# Patient Record
Sex: Male | Born: 1965 | Race: White | Hispanic: No | Smoking: Never smoker
Health system: Southern US, Community
[De-identification: ages and names within clinical notes are randomized; demographics above are authoritative.]

## PROBLEM LIST (undated history)

## (undated) DIAGNOSIS — N419 Inflammatory disease of prostate, unspecified: Secondary | ICD-10-CM

## (undated) DIAGNOSIS — Z800 Family history of malignant neoplasm of digestive organs: Secondary | ICD-10-CM

## (undated) DIAGNOSIS — E785 Hyperlipidemia, unspecified: Secondary | ICD-10-CM

## (undated) DIAGNOSIS — Z803 Family history of malignant neoplasm of breast: Secondary | ICD-10-CM

## (undated) DIAGNOSIS — Z8349 Family history of other endocrine, nutritional and metabolic diseases: Secondary | ICD-10-CM

## (undated) DIAGNOSIS — K219 Gastro-esophageal reflux disease without esophagitis: Secondary | ICD-10-CM

## (undated) DIAGNOSIS — T7840XA Allergy, unspecified, initial encounter: Secondary | ICD-10-CM

## (undated) DIAGNOSIS — C801 Malignant (primary) neoplasm, unspecified: Secondary | ICD-10-CM

## (undated) HISTORY — DX: Inflammatory disease of prostate, unspecified: N41.9

## (undated) HISTORY — DX: Family history of malignant neoplasm of digestive organs: Z80.0

## (undated) HISTORY — DX: Malignant (primary) neoplasm, unspecified: C80.1

## (undated) HISTORY — PX: TONSILLECTOMY: SUR1361

## (undated) HISTORY — DX: Hyperlipidemia, unspecified: E78.5

## (undated) HISTORY — PX: COLONOSCOPY: SHX174

## (undated) HISTORY — DX: Gastro-esophageal reflux disease without esophagitis: K21.9

## (undated) HISTORY — DX: Family history of other endocrine, nutritional and metabolic diseases: Z83.49

## (undated) HISTORY — DX: Allergy, unspecified, initial encounter: T78.40XA

## (undated) HISTORY — DX: Family history of malignant neoplasm of breast: Z80.3

## (undated) HISTORY — PX: ESOPHAGOGASTRODUODENOSCOPY: SHX1529

---

## 1997-10-02 ENCOUNTER — Encounter: Admission: RE | Admit: 1997-10-02 | Discharge: 1997-10-02 | Payer: Self-pay | Admitting: Family Medicine

## 1997-12-22 ENCOUNTER — Ambulatory Visit (HOSPITAL_COMMUNITY): Admission: RE | Admit: 1997-12-22 | Discharge: 1997-12-22 | Payer: Self-pay | Admitting: Internal Medicine

## 2000-04-18 ENCOUNTER — Encounter: Admission: RE | Admit: 2000-04-18 | Discharge: 2000-04-18 | Payer: Self-pay | Admitting: Sports Medicine

## 2002-11-05 ENCOUNTER — Encounter: Payer: Self-pay | Admitting: Gastroenterology

## 2002-11-05 ENCOUNTER — Ambulatory Visit (HOSPITAL_COMMUNITY): Admission: RE | Admit: 2002-11-05 | Discharge: 2002-11-05 | Payer: Self-pay | Admitting: Gastroenterology

## 2002-12-07 ENCOUNTER — Emergency Department (HOSPITAL_COMMUNITY): Admission: EM | Admit: 2002-12-07 | Discharge: 2002-12-07 | Payer: Self-pay | Admitting: Emergency Medicine

## 2012-04-29 ENCOUNTER — Emergency Department (HOSPITAL_COMMUNITY)
Admission: EM | Admit: 2012-04-29 | Discharge: 2012-04-29 | Disposition: A | Discharge status: Home / Self Care | Payer: Self-pay | Attending: Emergency Medicine | Admitting: Emergency Medicine

## 2012-04-29 ENCOUNTER — Encounter (HOSPITAL_COMMUNITY): Payer: Self-pay | Admitting: *Deleted

## 2012-04-29 DIAGNOSIS — T4995XA Adverse effect of unspecified topical agent, initial encounter: Secondary | ICD-10-CM | POA: Insufficient documentation

## 2012-04-29 DIAGNOSIS — T7840XA Allergy, unspecified, initial encounter: Secondary | ICD-10-CM | POA: Insufficient documentation

## 2012-04-29 DIAGNOSIS — L299 Pruritus, unspecified: Secondary | ICD-10-CM | POA: Insufficient documentation

## 2012-04-29 DIAGNOSIS — R42 Dizziness and giddiness: Secondary | ICD-10-CM | POA: Insufficient documentation

## 2012-04-29 MED ORDER — SODIUM CHLORIDE 0.9 % IV SOLN: INTRAVENOUS | Status: DC

## 2012-04-29 MED ORDER — SODIUM CHLORIDE 0.9 % IV BOLUS (SEPSIS)
250.0000 mL | Freq: Once | INTRAVENOUS | Status: AC
  Administered 2012-04-29: 250 mL via INTRAVENOUS

## 2012-04-29 MED ORDER — FAMOTIDINE 20 MG PO TABS: 20.0000 mg | ORAL_TABLET | Freq: Two times a day (BID) | ORAL | Status: DC

## 2012-04-29 MED ORDER — PREDNISONE 10 MG PO TABS: 40.0000 mg | ORAL_TABLET | Freq: Every day | ORAL | Status: DC

## 2012-04-29 MED ORDER — DIPHENHYDRAMINE HCL 25 MG PO TABS: 25.0000 mg | ORAL_TABLET | Freq: Four times a day (QID) | ORAL | Status: DC

## 2012-04-29 NOTE — ED Notes (Signed)
PT was on walking trail with family approx one HR ago when He reports a possible bee sting to Rt ankle . Pt A/O on arrival to ED . Air way open no difficulty breathing. Pt skin appeared red on trunk posterior and anterior. Hives and rash present on face ,neck and trunk. Redness and swelling noted on dorsal rt. Foot and ankle. Meds given per EMS : .3mg  epi IM , 50mg  Benadryl IV , 50 mg Zantac IV , 125mg  solumedrol. !*g IV to RT AC. PT speaking in full sentences.

## 2012-04-30 NOTE — ED Provider Notes (Signed)
History     CSN: [id number]  Arrival date & time 04/29/12  1423   First MD Initiated Contact with Patient 04/29/12 1439      Chief Complaint  Patient presents with  . Allergic Reaction    (Consider location/radiation/quality/duration/timing/severity/associated sxs/prior treatment) Patient is a 46 y.o. male presenting with allergic reaction. The history is provided by the patient and the EMS personnel.  Allergic Reaction The primary symptoms are  rash. The primary symptoms do not include shortness of breath, cough, abdominal pain, nausea or palpitations. The current episode started less than 1 hour ago. The problem has been rapidly improving.  The rash is associated with itching.  The onset of the reaction was associated with insect bite/sting. Significant symptoms also include itching. Significant symptoms that are not present include eye redness.   Hx of bee sting allergy, thinks stung by yellow jacket to top of right ankle foot. Started with itching in that area then progressed rapidly to itchy rash all over, mostly truck and face, then swelling of tongue, difficulty speaking and tightness in throat. EMS administered 0.3 mg Epi IM, Benadryl 50 mg , Zantac 50 mg, and solumedrol 125 mg the rest IV. Patient now improving in no acute distress.   History reviewed. No pertinent past medical history.  History reviewed. No pertinent past surgical history.  No family history on file.  History  Substance Use Topics  . Smoking status: Not on file  . Smokeless tobacco: Not on file  . Alcohol Use: Not on file      Review of Systems  Constitutional: Negative for fever and diaphoresis.  HENT: Negative for congestion, drooling, trouble swallowing and voice change.   Eyes: Negative for redness.  Respiratory: Negative for cough and shortness of breath.   Cardiovascular: Negative for chest pain and palpitations.  Gastrointestinal: Negative for nausea and abdominal pain.  Genitourinary:  Negative for dysuria.  Musculoskeletal: Negative for back pain.  Skin: Positive for itching and rash.  Neurological: Positive for light-headedness. Negative for syncope and headaches.  Hematological: Does not bruise/bleed easily.  Psychiatric/Behavioral: Negative for confusion.    Allergies  Bee venom and Other  Home Medications   Current Outpatient Rx  Name  Route  Sig  Dispense  Refill  . EPIPEN 2-PAK IJ   Injection   Inject 1 each as directed as needed. For reaction         . ROSUVASTATIN CALCIUM 40 MG PO TABS   Oral   Take 20 mg by mouth daily.         Marland Kitchen DIPHENHYDRAMINE HCL 25 MG PO TABS   Oral   Take 1 tablet (25 mg total) by mouth every 6 (six) hours.   12 tablet   0   . FAMOTIDINE 20 MG PO TABS   Oral   Take 1 tablet (20 mg total) by mouth 2 (two) times daily.   14 tablet   0   . PREDNISONE 10 MG PO TABS   Oral   Take 4 tablets (40 mg total) by mouth daily.   20 tablet   0     BP 129/83  Pulse 62  Temp 98.1 F (36.7 C) (Oral)  Resp 15  SpO2 99%  Physical Exam  Nursing note and vitals reviewed. Constitutional: He is oriented to person, place, and time. He appears well-developed and well-nourished. No distress.  HENT:  Head: Normocephalic and atraumatic.  Mouth/Throat: Oropharynx is clear and moist.       No  lip or tongue swelling.   Eyes: Conjunctivae normal and EOM are normal. Pupils are equal, round, and reactive to light.  Neck: Normal range of motion. Neck supple.  Cardiovascular: Normal rate, regular rhythm, normal heart sounds and intact distal pulses.   No murmur heard. Pulmonary/Chest: Effort normal and breath sounds normal. No stridor. No respiratory distress. He has no wheezes. He has no rales.  Abdominal: Soft. Bowel sounds are normal. There is no tenderness.  Musculoskeletal: Normal range of motion. He exhibits no edema.  Neurological: He is alert and oriented to person, place, and time. No cranial nerve deficit. He exhibits normal  muscle tone. Coordination normal.  Skin: Skin is warm. Rash noted. He is not diaphoretic.       Marked erythematous coalesced hive rash most extensive on trunk, neck and head, less so to UE and LE.     ED Course  Procedures (including critical care time)  Labs Reviewed - No data to display No results found.   1. Allergic reaction       MDM   Patient with significant allergic reaction, questionable bee sting, in patient with known allergy but did not have his epi-pen with him. No additional medications required in ED. Symptoms improved by medications given by EMS. Rash improving all other symptoms resolved. Will continue prednisone, benadryl, and pepcid at home. He does have epi-pen at home and in his car.   Observed in ED no worsening of symptoms.         Shelda Jakes, MD 04/30/12 1958

## 2012-09-30 ENCOUNTER — Ambulatory Visit: Payer: Self-pay | Admitting: Sports Medicine

## 2012-10-30 ENCOUNTER — Ambulatory Visit (INDEPENDENT_AMBULATORY_CARE_PROVIDER_SITE_OTHER): Payer: BC Managed Care – PPO | Admitting: Sports Medicine

## 2012-10-30 VITALS — BP 119/82 | Ht 76.0 in | Wt 195.0 lb

## 2012-10-30 DIAGNOSIS — M76899 Other specified enthesopathies of unspecified lower limb, excluding foot: Secondary | ICD-10-CM

## 2012-10-30 DIAGNOSIS — M7062 Trochanteric bursitis, left hip: Secondary | ICD-10-CM

## 2012-10-30 MED ORDER — NITROGLYCERIN 0.2 MG/HR TD PT24: MEDICATED_PATCH | TRANSDERMAL | Status: DC

## 2012-10-30 NOTE — Assessment & Plan Note (Addendum)
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 Patient will try and nitroglycerin secondary to chronic aspect of this to see if we can improve the healing with increasing neovascularization. Patient also given home exercise program which she will do a daily basis. Patient encouraged to try ibuprofen as needed as well as an icing protocol. Patient will try these interventions and then return again in 4-6 weeks' time. At that point if he continues to have pain we'll consider doing a ultrasound as well as questionable corticosteroid injection.

## 2012-10-30 NOTE — Patient Instructions (Addendum)
Very good to see you I am giving you some exercises that will be very helpful. Try to do them daily.  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  Come back in 6 weeks and if still in pain then will consider ultrasound to see in more detail.   Nitroglycerin Protocol   Apply 1/4 nitroglycerin patch to affected area daily.  Change position of patch within the affected area every 24 hours.  You may experience a headache during the first 1-2 weeks of using the patch, these should subside.  If you experience headaches after beginning nitroglycerin patch treatment, you may take your preferred over the counter pain reliever.  Another side effect of the nitroglycerin patch is skin irritation or rash related to patch adhesive.  Please notify our office if you develop more severe headaches or rash, and stop the patch.  Tendon healing with nitroglycerin patch may require 12 to 24 weeks depending on the extent of injury.  Men should not use if taking Viagra, Cialis, or Levitra.   Do not use if you have migraines or rosacea.

## 2012-10-30 NOTE — Progress Notes (Signed)
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  States has hx of hip pain for quite some time.  Denies any injury but had acute IT band while was running.  No longer running but still playing basketball from time to time. States it stays down the lateral leg, worse after sitting a long amount of time and with certain activities. States it is a dull aching sensation and then worse with laying down at night. It does wake him up at night.  Denies swelling, denies numbness.  Patient has tried some ibuprofen with mild improvement.  Still able to do ADL's.  Denies groin pain.   Past medical history, social, surgical and family history all reviewed.   ROS: As stated in HPI  Physical Exam Blood pressure 119/82, height 6\' 4"  (1.93 m), weight 195 lb (88.451 kg). General: No apparent distress alert and oriented x3 mood and affect normal Respiratory: Patient's speak in full sentences and does not appear short of breath Skin: Warm dry intact with no signs of infection or rash Neuro: Cranial nerves II through XII are intact, neurovascularly intact in all extremities with 2+ DTRs and 2+ pulses. Left hip exam: Patient does have full external and internal range of motion. Patient has no groin pain with range of motion testing. Patient does have pain on the lateral aspect of the hip though with internal rotation. Patient is tender to palpation over the greater trochanteric area as well as down the IT band all the way to the knee. Patient does have a mildly positive Pearlean Brownie with pain on the lateral aspect of the leg. Patient has a negative straight leg test of the back. He is neurovascularly intact distally with a 2+ DTRs. The patient is extremely weak with hip abduction bilaterally.

## 2012-12-11 ENCOUNTER — Ambulatory Visit: Payer: Self-pay | Admitting: Sports Medicine

## 2014-10-17 ENCOUNTER — Encounter (HOSPITAL_COMMUNITY): Payer: Self-pay | Admitting: Emergency Medicine

## 2014-10-17 ENCOUNTER — Emergency Department (HOSPITAL_COMMUNITY)
Admission: EM | Admit: 2014-10-17 | Discharge: 2014-10-17 | Disposition: A | Discharge status: Home / Self Care | Payer: Managed Care, Other (non HMO) | Attending: Emergency Medicine | Admitting: Emergency Medicine

## 2014-10-17 DIAGNOSIS — F32A Depression, unspecified: Secondary | ICD-10-CM

## 2014-10-17 DIAGNOSIS — Z79899 Other long term (current) drug therapy: Secondary | ICD-10-CM | POA: Insufficient documentation

## 2014-10-17 DIAGNOSIS — F419 Anxiety disorder, unspecified: Secondary | ICD-10-CM | POA: Diagnosis not present

## 2014-10-17 DIAGNOSIS — F329 Major depressive disorder, single episode, unspecified: Secondary | ICD-10-CM | POA: Diagnosis not present

## 2014-10-17 DIAGNOSIS — Z87891 Personal history of nicotine dependence: Secondary | ICD-10-CM | POA: Diagnosis not present

## 2014-10-17 LAB — CBC
HCT: 42.8 % (ref 39.0–52.0)
Hemoglobin: 14.5 g/dL (ref 13.0–17.0)
MCH: 30.9 pg (ref 26.0–34.0)
MCHC: 33.9 g/dL (ref 30.0–36.0)
MCV: 91.1 fL (ref 78.0–100.0)
PLATELETS: 262 10*3/uL (ref 150–400)
RBC: 4.7 MIL/uL (ref 4.22–5.81)
RDW: 12.8 % (ref 11.5–15.5)
WBC: 10.6 10*3/uL — ABNORMAL HIGH (ref 4.0–10.5)

## 2014-10-17 LAB — COMPREHENSIVE METABOLIC PANEL
ALK PHOS: 57 U/L (ref 39–117)
ALT: 30 U/L (ref 0–53)
ANION GAP: 8 (ref 5–15)
AST: 24 U/L (ref 0–37)
Albumin: 4.7 g/dL (ref 3.5–5.2)
BUN: 12 mg/dL (ref 6–23)
CALCIUM: 9.2 mg/dL (ref 8.4–10.5)
CO2: 26 mmol/L (ref 19–32)
Chloride: 106 mmol/L (ref 96–112)
Creatinine, Ser: 0.87 mg/dL (ref 0.50–1.35)
GLUCOSE: 93 mg/dL (ref 70–99)
POTASSIUM: 4.3 mmol/L (ref 3.5–5.1)
SODIUM: 140 mmol/L (ref 135–145)
Total Bilirubin: 0.6 mg/dL (ref 0.3–1.2)
Total Protein: 7.6 g/dL (ref 6.0–8.3)

## 2014-10-17 LAB — SALICYLATE LEVEL

## 2014-10-17 LAB — ETHANOL: ALCOHOL ETHYL (B): 28 mg/dL — AB (ref 0–9)

## 2014-10-17 LAB — ACETAMINOPHEN LEVEL: Acetaminophen (Tylenol), Serum: <10 ug/mL — ABNORMAL LOW (ref 10–30)

## 2014-10-17 NOTE — ED Notes (Signed)
Pt arrived to the ED with a complaint of anxiety.  Pt states he was caught by his wife having a affair.  Pt's wife kicked him out of the house today.  Pt was anxious and called a hotline to talk about his anxiety.  The hotline employees recognized pt and called patients wife to tell her that pt had "dark thoughts."  Pt states he didn't make any ideations of suicide.  Pt denies SI/HI.  Pt states he just wants to talk to someone about the events of the day.

## 2014-10-17 NOTE — ED Notes (Signed)
Pt alert, oriented, and ambulatory upon DC> He was advised to follow up with his PCP as soon as possible. Pt leaves with wife.

## 2014-10-17 NOTE — Discharge Instructions (Signed)

## 2014-10-17 NOTE — ED Provider Notes (Signed)
CSN: [id number]     Arrival date & time 10/17/14  1935 History   First MD Initiated Contact with Patient 10/17/14 2116     Chief Complaint  Patient presents with  . Anxiety      HPI Patient presents the emergency department complaining of marital problems and anxiety and depressive symptoms.  He reports no homicidal or suicidal thoughts.  He called the suicide hotline tonight only because he was aware of his general feelings of depression and his recent split from his wife and prior to any symptoms beginning anyone to know what his options were.  He does not have guns in the house.  I spoke with the patient and the patient's spouse at length.  There is marital problems as the patient was found to be having an affair recently.  The patient was confronted by his wife 3 days ago and served with divorce paperwork.  He denies any prior suicide attempts.  He reports no suicidal thoughts at this time.  I also spoke with his wife who reports that she does not think that he will hurt himself either.  He does admit to drinking a glass of wine tonight when he went back to his office.  Past medical history: None Past surgical history: Noncontributory. Family history: Noncontributory  History  Substance Use Topics  . Smoking status: Former Research scientist (life sciences)  . Smokeless tobacco: Never Used  . Alcohol Use: Yes    Review of Systems  All other systems reviewed and are negative.     Allergies  Bee venom and Other  Home Medications   Prior to Admission medications   Medication Sig Start Date End Date Taking? Authorizing Provider  Ascorbic Acid (VITAMIN C PO) Take 1 tablet by mouth daily.   Yes Historical Provider, MD  BEE POLLEN PO Take 1 tablet by mouth daily.   Yes Historical Provider, MD  cetirizine (ZYRTEC) 10 MG tablet Take 10 mg by mouth daily as needed for allergies.   Yes Historical Provider, MD  Cholecalciferol (VITAMIN D PO) Take 1 tablet by mouth daily.   Yes Historical Provider, MD  CRANBERRY  PO Take 1 tablet by mouth daily.   Yes Historical Provider, MD  EPINEPHrine (EPIPEN 2-PAK IJ) Inject 1 each as directed as needed. For reaction   Yes Historical Provider, MD  GINSENG PO Take 1 capsule by mouth daily.   Yes Historical Provider, MD  L-ARGININE PO Take 1 capsule by mouth daily.   Yes Historical Provider, MD  Lactobacillus (ACIDOPHILUS PO) Take 1 capsule by mouth daily.   Yes Historical Provider, MD  LYSINE PO Take 1 capsule by mouth daily.   Yes Historical Provider, MD  Misc Natural Products (PROSTATE SUPPORT PO) Take 1 capsule by mouth daily.   Yes Historical Provider, MD  Omega-3 Fatty Acids (FISH OIL PO) Take 1 capsule by mouth daily.   Yes Historical Provider, MD  QUERCETIN PO Take 1 capsule by mouth daily.   Yes Historical Provider, MD  Stinging Nettle 2 % POWD Take 1 capsule by mouth daily.   Yes Historical Provider, MD  TURMERIC PO Take 1 capsule by mouth daily.   Yes Historical Provider, MD  VITAMIN A PO Take 1 tablet by mouth daily.   Yes Historical Provider, MD  nitroGLYCERIN (NITRODUR - DOSED IN MG/24 HR) 0.2 mg/hr 1/4 patch daily Patient not taking: Reported on 10/17/2014 10/30/12   Lyndal Pulley, DO   BP 130/97 mmHg  Pulse 63  Temp(Src) 98.2 F (36.8  C) (Oral)  Resp 16  SpO2 96% Physical Exam  Constitutional: He is oriented to person, place, and time. He appears well-developed and well-nourished.  HENT:  Head: Normocephalic and atraumatic.  Eyes: EOM are normal.  Neck: Normal range of motion.  Cardiovascular: Normal rate, regular rhythm, normal heart sounds and intact distal pulses.   Pulmonary/Chest: Effort normal and breath sounds normal. No respiratory distress.  Abdominal: Soft. He exhibits no distension. There is no tenderness.  Musculoskeletal: Normal range of motion.  Neurological: He is alert and oriented to person, place, and time.  Skin: Skin is warm and dry.  Psychiatric: He has a normal mood and affect. Judgment normal.  Nursing note and vitals  reviewed.   ED Course  Procedures (including critical care time) Labs Review Labs Reviewed  ACETAMINOPHEN LEVEL - Abnormal; Notable for the following:    Acetaminophen (Tylenol), Serum <10.0 (*)    All other components within normal limits  CBC - Abnormal; Notable for the following:    WBC 10.6 (*)    All other components within normal limits  ETHANOL - Abnormal; Notable for the following:    Alcohol, Ethyl (B) 28 (*)    All other components within normal limits  COMPREHENSIVE METABOLIC PANEL  SALICYLATE LEVEL    Imaging Review No results found.   EKG Interpretation None      MDM   Final diagnoses:  Depression    I had a long discussion with the patient the patient's spouse.  I do not believe the patient is a threat to himself or others at this time.  The wife agrees.  The patient is going to go home with his wife tonight.  They're currently going to work on their marital issues.  I've asked the patient to return the emergency department for any thoughts of suicide or thoughts of harming himself or others.    Jola Schmidt, MD 10/17/14 [phone]

## 2014-11-15 ENCOUNTER — Emergency Department
Admission: EM | Admit: 2014-11-15 | Discharge: 2014-11-15 | Disposition: A | Discharge status: Home / Self Care | Payer: Commercial Managed Care - POS | Attending: Emergency Medicine | Admitting: Emergency Medicine

## 2014-11-15 ENCOUNTER — Emergency Department: Payer: Self-pay

## 2014-11-15 DIAGNOSIS — K610 Anal abscess: Secondary | ICD-10-CM | POA: Diagnosis present

## 2014-11-15 DIAGNOSIS — Z539 Procedure and treatment not carried out, unspecified reason: Secondary | ICD-10-CM | POA: Insufficient documentation

## 2014-11-15 DIAGNOSIS — K611 Rectal abscess: Secondary | ICD-10-CM | POA: Insufficient documentation

## 2014-11-15 LAB — CBC AND DIFFERENTIAL
Basophils Absolute Automated: 0.01 10*3/uL (ref 0.00–0.20)
Basophils Automated: 0 %
Eosinophils Absolute Automated: 0.21 10*3/uL (ref 0.00–0.70)
Eosinophils Automated: 2 %
Hematocrit: 38.2 % — ABNORMAL LOW (ref 42.0–52.0)
Hgb: 13.3 g/dL (ref 13.0–17.0)
Immature Granulocytes Absolute: 0.03 10*3/uL
Immature Granulocytes: 0 %
Lymphocytes Absolute Automated: 2.08 10*3/uL (ref 0.50–4.40)
Lymphocytes Automated: 15 %
MCH: 31.9 pg (ref 28.0–32.0)
MCHC: 34.8 g/dL (ref 32.0–36.0)
MCV: 91.6 fL (ref 80.0–100.0)
MPV: 9.7 fL (ref 9.4–12.3)
Monocytes Absolute Automated: 1.24 10*3/uL — ABNORMAL HIGH (ref 0.00–1.20)
Monocytes: 9 %
Neutrophils Absolute: 10.56 10*3/uL — ABNORMAL HIGH (ref 1.80–8.10)
Neutrophils: 75 %
Nucleated RBC: 0 /100 WBC (ref 0–1)
Platelets: 231 10*3/uL (ref 140–400)
RBC: 4.17 10*6/uL — ABNORMAL LOW (ref 4.70–6.00)
RDW: 12 % (ref 12–15)
WBC: 14.13 10*3/uL — ABNORMAL HIGH (ref 3.50–10.80)

## 2014-11-15 NOTE — H&P (Signed)
HISTORY AND PHYSICAL EXAM  Surgery  Team 4: General Surgery/[facility], [facility] [phone]    Date Time: 11/15/2014 10:17 PM  Patient Name: Jeffery Harris  Attending Physician: Jerilynn Som, MD  Chief Complaint: perianal abscess    History of Present Illness:   Jeffery Harris is a 49 y.o. male with history of perianal abscess treated in January with I&D by general surgeon in [place] who presents to the hospital with increased perianal pain and swelling onset two days ago.  Denies fevers, chills.    Past Medical History:     Past Medical History   Diagnosis Date   . Prostatitis        Past Surgical History:   History reviewed. No pertinent past surgical history.    Family History:   History reviewed. No pertinent family history.    Social History:     History     Social History   . Marital Status: Married     Spouse Name: N/A   . Number of Children: N/A   . Years of Education: N/A     Social History Main Topics   . Smoking status: Never Smoker    . Smokeless tobacco: Not on file   . Alcohol Use: Yes   . Drug Use: No   . Sexual Activity: Not on file     Other Topics Concern   . Not on file     Social History Narrative   . No narrative on file       Allergies:     Allergies   Allergen Reactions   . Penicillins        Medications:     Prior to Admission medications    Not on File       Review of Systems:   Negative except as in HPI      Physical Exam:     Filed Vitals:    11/15/14 2032   BP: 128/82   Pulse: 79   Temp: 98.7 F (37.1 C)   Resp: 16   SpO2: 98%       Body mass index is 24.36 kg/(m^2).    Intake and Output Summary (Last 24 hours) at Date Time  No intake or output data in the 24 hours ending 11/15/14 2217    General:    Alert, cooperative, no distress, appears stated age   Head:    Normocephalic, without obvious abnormality, atraumatic   Eyes:    conjunctiva/corneas clear, EOM's intact, both eyes        Ears:    Gross hearing intact   Neck:   Supple   Lungs:     Clear to auscultation bilaterally, respirations unlabored    Heart:    Regular rate and rhythm   Abdomen:     Soft, non-tender, nondistended, no rebound/guarding   Rectal:   Left perianal abscess now spontaneously draining purulent material, minimal surrounding erythema,    Extremities:   Extremities normal, atraumatic, no cyanosis or edema   Pulses:   2+ and symmetric all extremities   Skin:   Skin color, texture, turgor normal, no rashes or lesions   Neurologic:   CNII-XII grossly intact.          Labs:     Recent CBC   Recent Labs      11/15/14   2140   RBC  4.17*   HGB  13.3   HEMATOCRIT  38.2*   MCV  91.6   MCH  31.9  MCHC  34.8   RDW  12   MPV  9.7     Recent BMP No results for input(s): GLU, BUN, CREAT, CA, NA, K, CL, CO2 in the last 24 hours.    Invalid input(s): AGAP    Rads:   none    Problem List:     Patient Active Problem List   Diagnosis   . Perianal abscess       Assessment:   49 y.o. male with perianal abscess now spontaneously draining well, patient with much relief    Plan:   Ok to discharge home  Patient will f/u with GS at home in [facility]  Pt d/w staff, Dr. Hazle Nordmann, MD  General Surgery PGY3  [phone]    I reviewed the patients presentation and exam. I agree with the plan for discharge with follow up with his surgeon at his home city as it is spontaneously draining.   Clelia Croft MD  FACS  [facility] [phone]

## 2014-11-15 NOTE — ED Provider Notes (Signed)
[place] [facility] EMERGENCY DEPARTMENT APP H&P         CLINICAL SUMMARY          Diagnosis:    .     Final diagnoses:   Perirectal abscess         MDM Notes:        MDM    Disposition:           ED Disposition     Discharge Jeffery Harris discharge to home/self care.    Condition at disposition: Stable                      CLINICAL INFORMATION        HPI:      Chief Complaint: Abscess  .    Jeffery Harris is a 49 y.o. male hx of rectal abscess (4 months ago) s/p I&D by gen surg who presents with 2 days of perirectal pain c/o radiating to LT groin, suprapubic area today. His pain is similar to his prior hx of rectal abscess. He took 4x ibuprofen with no sig relief to sxs. He denies fever, n/v/d, urinary sxs, hx of DM, taking immunosuppressants, undergoing chemo, radiation or other concerns     He is visiting from out of town from [place] and plans to return home tomorrow       History obtained from: Patient      ROS:      Positive and negative ROS elements as per HPI.  All other systems reviewed and negative.      Physical Exam:      Pulse 79  BP 128/82 mmHg  Resp 16  SpO2 98 %  Temp 98.7 F (37.1 C)    Physical Exam   Constitutional: He is oriented to person, place, and time. He appears well-developed and well-nourished. No distress.   HENT:   Head: Normocephalic and atraumatic.   Right Ear: External ear normal.   Left Ear: External ear normal.   Eyes: Conjunctivae and EOM are normal. Pupils are equal, round, and reactive to light.   Pulmonary/Chest: Effort normal. No respiratory distress.   Genitourinary: Rectal exam shows tenderness.   2-3 cm fluctuant perianal abscess, with associated erythema and induration which tracks to perineal region, no drainage, TTP   Neurological: He is alert and oriented to person, place, and time. Coordination normal.   Skin: Skin is warm and dry. No rash noted. He is not diaphoretic. There is erythema. No pallor.   Psychiatric: He has a normal mood  and affect. His behavior is normal. Judgment and thought content normal. His speech is not slurred. Cognition and memory are normal. He is communicative. He is attentive.   Nursing note and vitals reviewed.                PAST HISTORY        Primary Care Provider: Christa See, MD        PMH/PSH:    .     Past Medical History   Diagnosis Date   . Prostatitis        He has no past surgical history on file.      Social/Family History:      He reports that he has never smoked. He does not have any smokeless tobacco history on file. He reports that he drinks alcohol. He reports that he does not use illicit drugs.    History reviewed. No pertinent family history.  Listed Medications on Arrival:    .     There are no discharge medications for this patient.     Allergies: He is allergic to penicillins.            VISIT INFORMATION        Clinical Course in the ED:      9:18 PM [facility] surgery res will consult        Medications Given in the ED:    .     ED Medication Orders     None            Procedures:      Procedures      Interpretations:                  RESULTS        Lab Results:      Results     Procedure Component Value Units Date/Time    CBC and differential [[id number]]  (Abnormal) Collected:  11/15/14 2140    Specimen Information:  Blood from Blood Updated:  11/15/14 2208     WBC 14.13 (H) x10 3/uL      Hgb 13.3 g/dL      Hematocrit 40.9 (L) %      Platelets 231 x10 3/uL      RBC 4.17 (L) x10 6/uL      MCV 91.6 fL      MCH 31.9 pg      MCHC 34.8 g/dL      RDW 12 %      MPV 9.7 fL      Neutrophils 75 %      Lymphocytes Automated 15 %      Monocytes 9 %      Eosinophils Automated 2 %      Basophils Automated 0 %      Immature Granulocyte 0 %      Nucleated RBC 0 /100 WBC      Neutrophils Absolute 10.56 (H) x10 3/uL      Abs Lymph Automated 2.08 x10 3/uL      Abs Mono Automated 1.24 (H) x10 3/uL      Abs Eos Automated 0.21 x10 3/uL      Absolute Baso Automated 0.01 x10 3/uL      Absolute Immature  Granulocyte 0.03 x10 3/uL               Radiology Results:      No orders to display               Scribe Attestation:      Attestations:  I was acting as a Neurosurgeon for [employer], PA on [employer]. Treatment Team: Scribe: Kavin Leech    I am the first provider for this patient and I personally performed the services documented. Treatment Team: Scribe: Kavin Leech is scribing for me on [place],Jeffery Harris. This note accurately reflects work and decisions made by me.  Erick Blinks, PA                                 Erick Blinks, [place]  11/16/14 [phone]

## 2014-11-15 NOTE — ED Provider Notes (Signed)
Physician/Midlevel provider first contact with patient: 11/15/14 2027         The patient was seen and examined by the mid-level (physician's assistant or nurse practitioner), or fellow, and the plan of care was discussed with me. I agree with the plan as it was presented to me.   Floy Sabina, MD    [phone], M with pertinent pmhx of perianal abscess (4 months ago) s/p I&D by general surgery who p/w 2 days of perirectal pain radiating to Lt groin and suprapubic area today.  This pain is similar to his prior perianal abscess.  Denies fever, nausea, vomiting, diarrhea, urinary sx.  He took 800mg  of ibuprofen with no relief of sx.       He is visiting from out of town from [place] and plans to return home tomorrow.    Discussed with surgery who recommend discharge with outpatient follow up with his surgeon - no antibiotics.     Scribe Attestation:  I was acting as a Neurosurgeon for Eleah Lahaie, Marshia Ly, MD on Liliane Bade, [place]  I am the first provider for this patient and I personally performed the services documented. Dorathy Daft is scribing for me on [place], [place]. This note accurately reflects work and decisions made by me.  Shantea Poulton, Marshia Ly, MD      Zohaib Heeney, Marshia Ly, MD  11/15/14 [phone]

## 2014-11-15 NOTE — Discharge Instructions (Signed)
Rest.  Drink plenty of fluids.  Follow up with your surgeon as scheduled.  You may take tylenol and/or ibuprofen for pain.  Do not take more than 4 grams of tylenol in a 24 hour period or more than 800mg  ibuprofen every 8 hours.  Return to the ED at any time for worsening symptoms or any other concerns.

## 2014-11-16 DIAGNOSIS — K610 Anal abscess: Secondary | ICD-10-CM | POA: Diagnosis present

## 2015-01-17 ENCOUNTER — Encounter (HOSPITAL_COMMUNITY): Payer: Self-pay | Admitting: Emergency Medicine

## 2015-01-17 ENCOUNTER — Emergency Department (HOSPITAL_COMMUNITY)
Admission: EM | Admit: 2015-01-17 | Discharge: 2015-01-17 | Disposition: A | Discharge status: Home / Self Care | Payer: Managed Care, Other (non HMO) | Attending: Emergency Medicine | Admitting: Emergency Medicine

## 2015-01-17 ENCOUNTER — Emergency Department (HOSPITAL_COMMUNITY): Payer: Managed Care, Other (non HMO)

## 2015-01-17 DIAGNOSIS — J029 Acute pharyngitis, unspecified: Secondary | ICD-10-CM | POA: Insufficient documentation

## 2015-01-17 DIAGNOSIS — Z79899 Other long term (current) drug therapy: Secondary | ICD-10-CM | POA: Insufficient documentation

## 2015-01-17 DIAGNOSIS — F419 Anxiety disorder, unspecified: Secondary | ICD-10-CM | POA: Insufficient documentation

## 2015-01-17 DIAGNOSIS — Z87891 Personal history of nicotine dependence: Secondary | ICD-10-CM | POA: Insufficient documentation

## 2015-01-17 LAB — CBC WITH DIFFERENTIAL/PLATELET
BASOS ABS: 0 10*3/uL (ref 0.0–0.1)
Basophils Relative: 0 % (ref 0–1)
Eosinophils Absolute: 0.3 10*3/uL (ref 0.0–0.7)
Eosinophils Relative: 3 % (ref 0–5)
HEMATOCRIT: 40.2 % (ref 39.0–52.0)
Hemoglobin: 13.7 g/dL (ref 13.0–17.0)
LYMPHS PCT: 12 % (ref 12–46)
Lymphs Abs: 1.4 10*3/uL (ref 0.7–4.0)
MCH: 30.9 pg (ref 26.0–34.0)
MCHC: 34.1 g/dL (ref 30.0–36.0)
MCV: 90.5 fL (ref 78.0–100.0)
Monocytes Absolute: 0.6 10*3/uL (ref 0.1–1.0)
Monocytes Relative: 5 % (ref 3–12)
NEUTROS ABS: 9.4 10*3/uL — AB (ref 1.7–7.7)
Neutrophils Relative %: 80 % — ABNORMAL HIGH (ref 43–77)
PLATELETS: 219 10*3/uL (ref 150–400)
RBC: 4.44 MIL/uL (ref 4.22–5.81)
RDW: 13.2 % (ref 11.5–15.5)
WBC: 11.6 10*3/uL — AB (ref 4.0–10.5)

## 2015-01-17 LAB — I-STAT CHEM 8, ED
BUN: 8 mg/dL (ref 6–20)
CALCIUM ION: 1.09 mmol/L — AB (ref 1.12–1.23)
CREATININE: 1 mg/dL (ref 0.61–1.24)
Chloride: 103 mmol/L (ref 101–111)
GLUCOSE: 100 mg/dL — AB (ref 65–99)
HCT: 41 % (ref 39.0–52.0)
Hemoglobin: 13.9 g/dL (ref 13.0–17.0)
Potassium: 4 mmol/L (ref 3.5–5.1)
Sodium: 139 mmol/L (ref 135–145)
TCO2: 22 mmol/L (ref 0–100)

## 2015-01-17 LAB — RAPID STREP SCREEN (MED CTR MEBANE ONLY): STREPTOCOCCUS, GROUP A SCREEN (DIRECT): NEGATIVE

## 2015-01-17 IMAGING — CT CT NECK W/ CM
4 of 5 series · 16 of 33 positions shown, 18 images · IV contrast (OMNIPAQUE 300)
Comparison: Prior radiograph from earlier the same day.

CLINICAL DATA: Initial evaluation for acute sore throat for 1 day.

EXAM:
CT NECK WITH CONTRAST
TECHNIQUE: Multidetector CT imaging of the neck was performed using the
standard protocol following the bolus administration of intravenous
contrast.
CONTRAST:  100mL OMNIPAQUE IOHEXOL 300 MG/ML  SOLN

[Series 3: neck with st · axial · 0.39mm/px · z∈[-256,-110]mm · 4 of 123 slices shown, 5 images]
[im 25/123  soft-tissue]
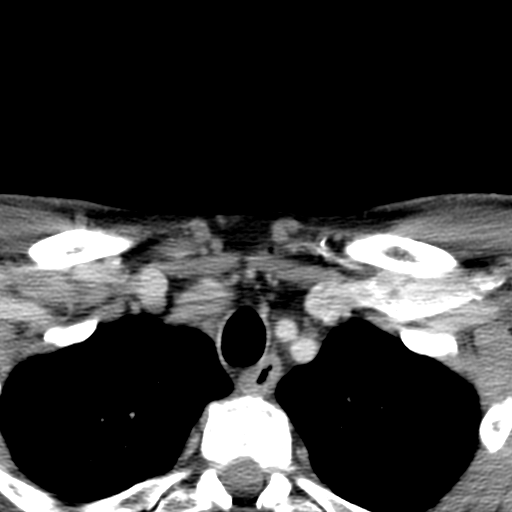
[im 25/123  bone]
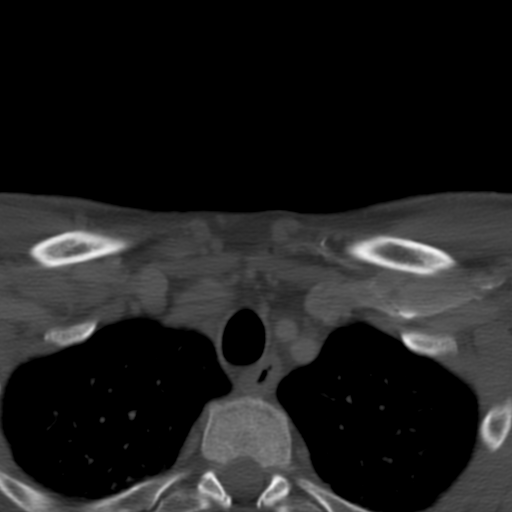
[im 49/123  bone]
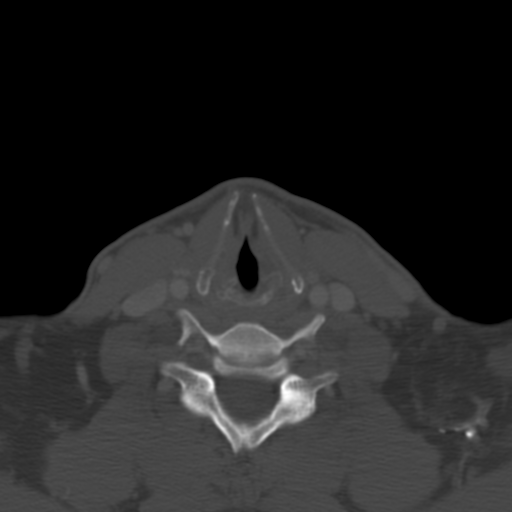
[im 74/123  bone]
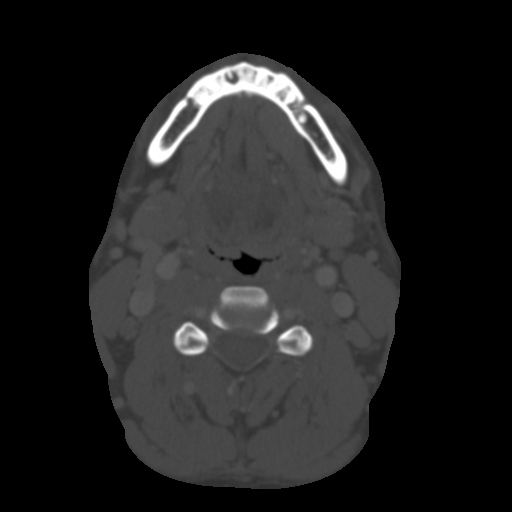
[im 98/123  bone]
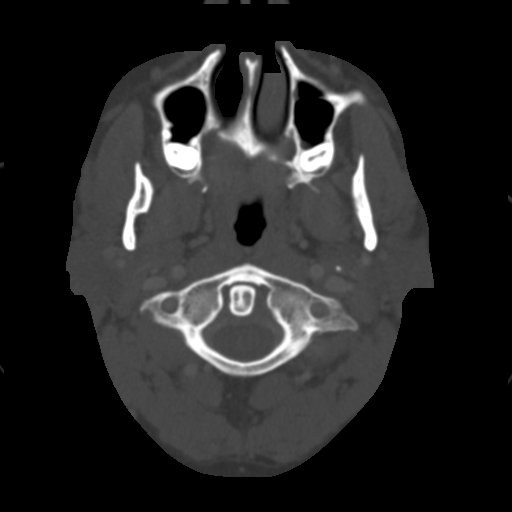

[Series 6: coronal st · coronal · 0.53mm/px · 3 of 100 slices shown]
[im 32/100  bone]
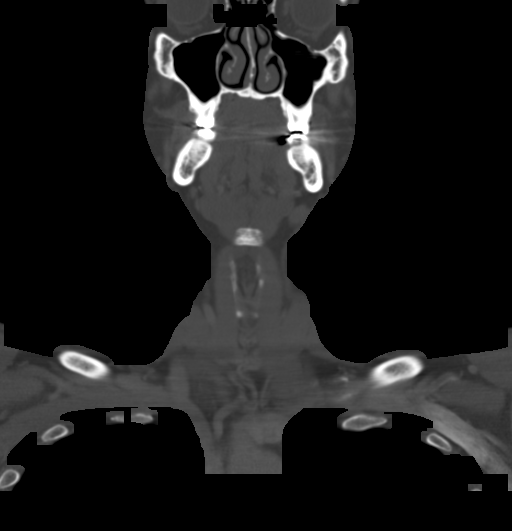
[im 44/100  bone]
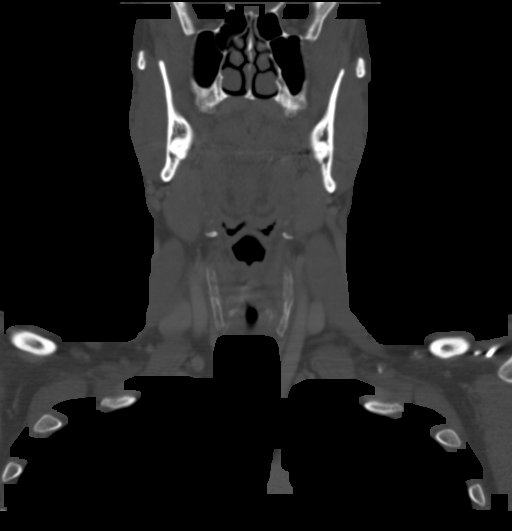
[im 56/100  bone]
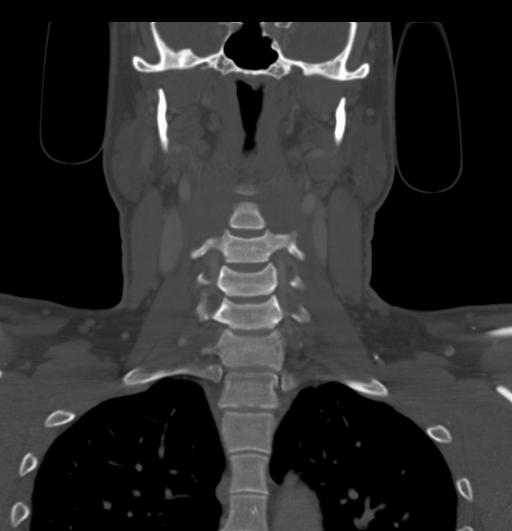

[Series 7: sagittal st · sagittal · 0.44mm/px · 5 of 82 slices shown, 6 images]
[im 28/82  bone]
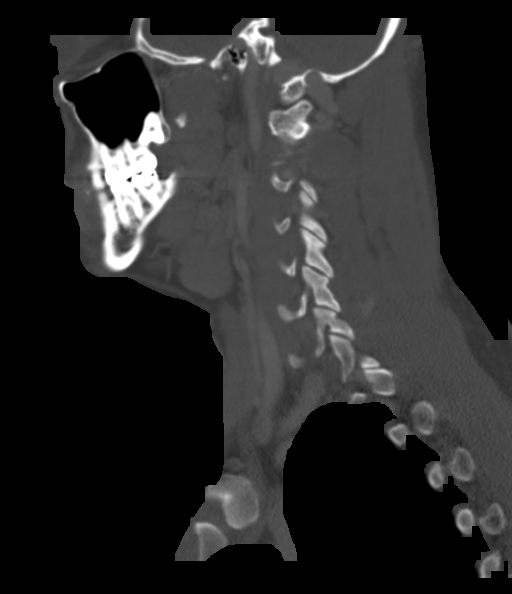
[im 34/82  bone]
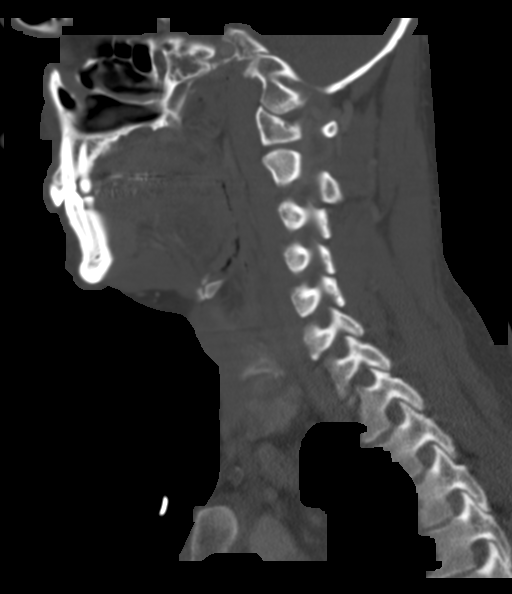
[im 41/82  soft-tissue]
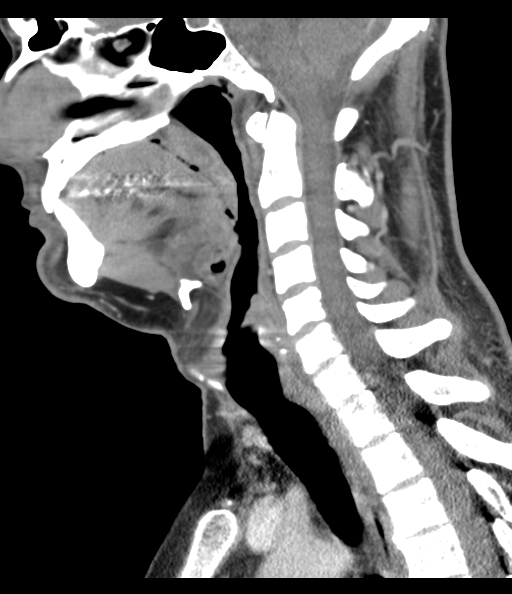
[im 41/82  bone]
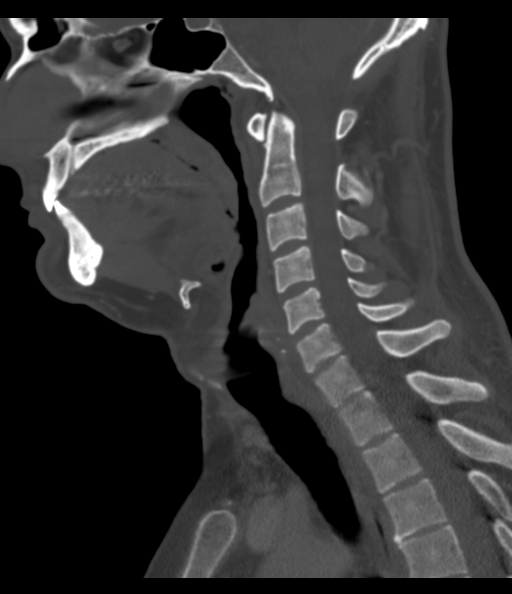
[im 48/82  bone]
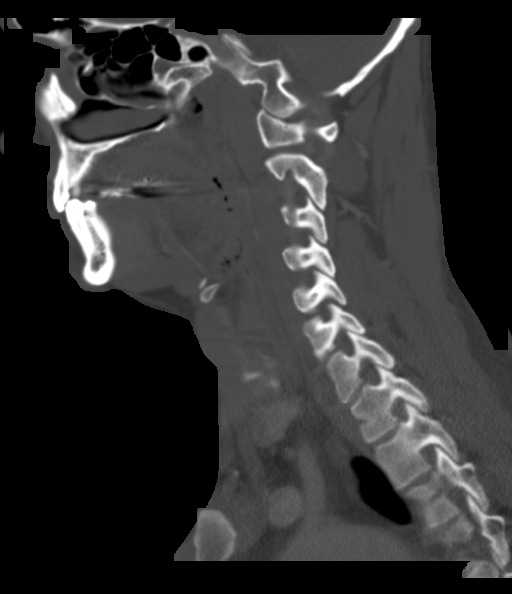
[im 55/82  bone]
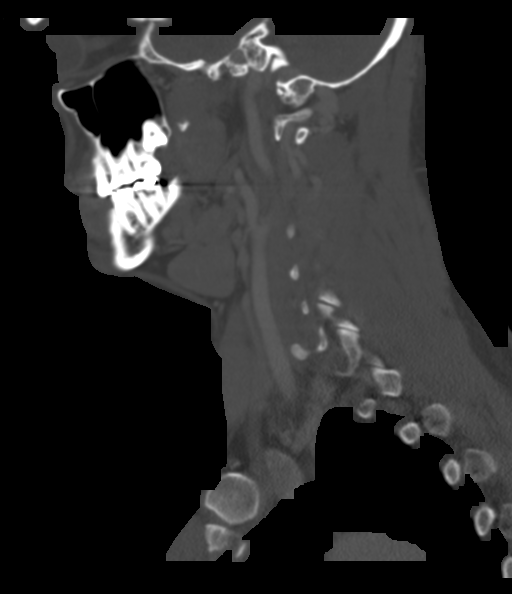

[Series 8: axial recons · axial · 0.39mm/px · z∈[-298,-149]mm · 4 of 134 slices shown]
[im 27/134  bone]
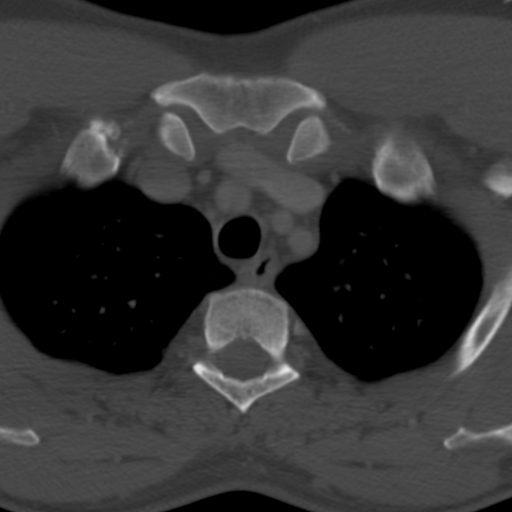
[im 54/134  bone]
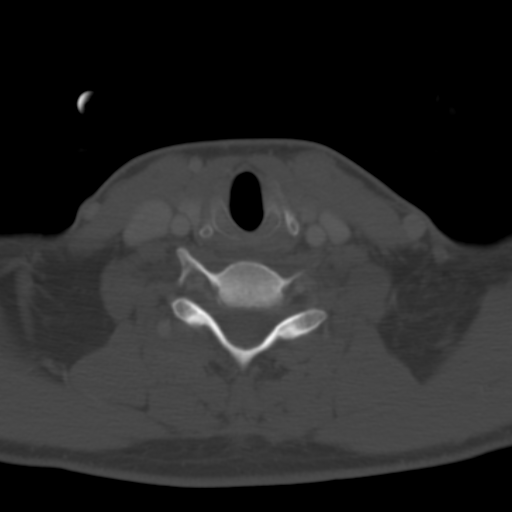
[im 80/134  bone]
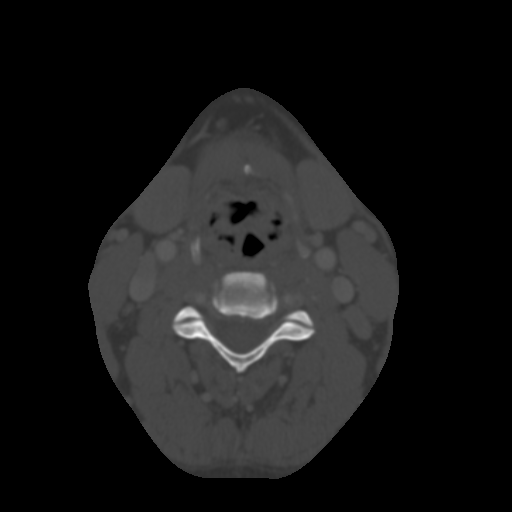
[im 107/134  bone]
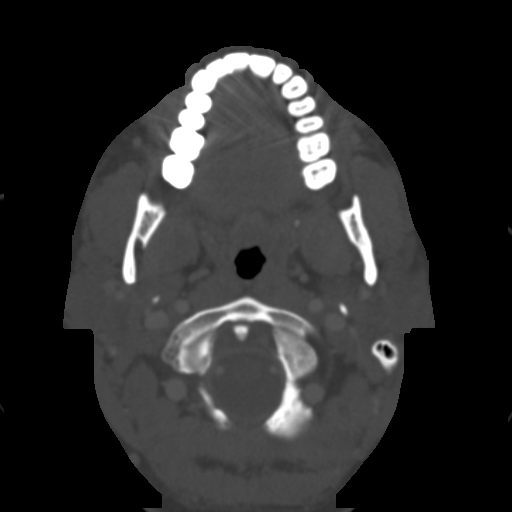

[16 of 33 positions shown; findings below may reference images not displayed]

FINDINGS: Visualized portions of the brain demonstrate a normal appearance.
Partially visualized globes and orbits are unremarkable.

Visualized paranasal sinuses and mastoid air cells are clear. Middle
ear cavities are clear.

The salivary glands including the parotid glands and submandibular
glands are normal in appearance.

Oral cavity is unremarkable without evidence of mass lesion or
loculated fluid collection. Palatine tonsils are symmetric and
normal bilaterally. Parapharyngeal fat is preserved. Oropharynx and
nasopharynx are within normal limits. Epiglottis is normal.
Vallecula is clear. Mild lingual tonsil hypertrophy. No
retropharyngeal fluid collection. No significant swelling or edema
seen within the hypopharynx or supraglottic larynx. True vocal cords
are symmetric and normal bilaterally. Subglottic airway is clear.

Thyroid gland is normal.

No pathologically enlarged lymph nodes identified within the neck.

Partially visualized superior mediastinum within normal limits.

Visualized lungs are clear.

Normal intravascular enhancement seen within the neck.

No acute osseous abnormality. No worrisome lytic or blastic osseous
lesions.
IMPRESSION: Normal neck CT.  No significant inflammatory changes identified.

## 2015-01-17 IMAGING — CR DG NECK SOFT TISSUE
2 series · 2 of 2 positions shown · non-contrast
Comparison: None.

CLINICAL DATA: Difficulty swallowing.  Sore throat for 1 day.

EXAM:
NECK SOFT TISSUES - 1+ VIEW

[w soft tissue neck ap]
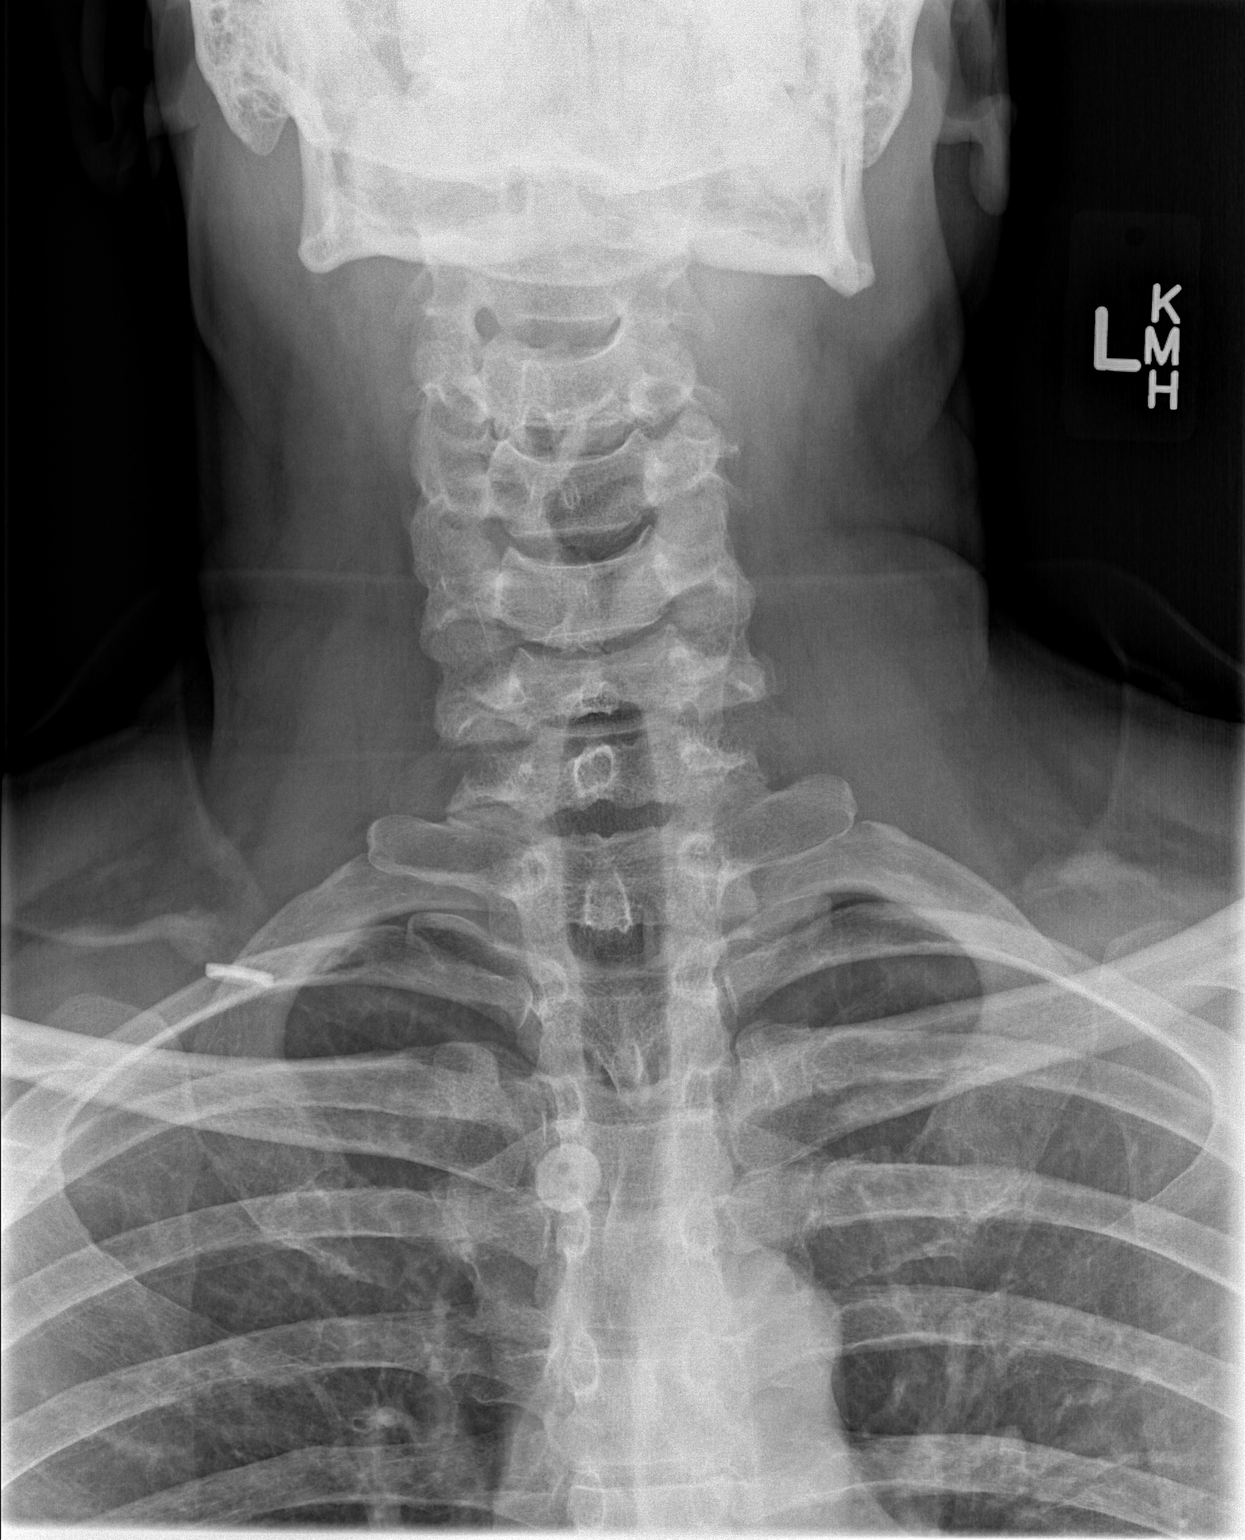

[w soft tissue neck lat]
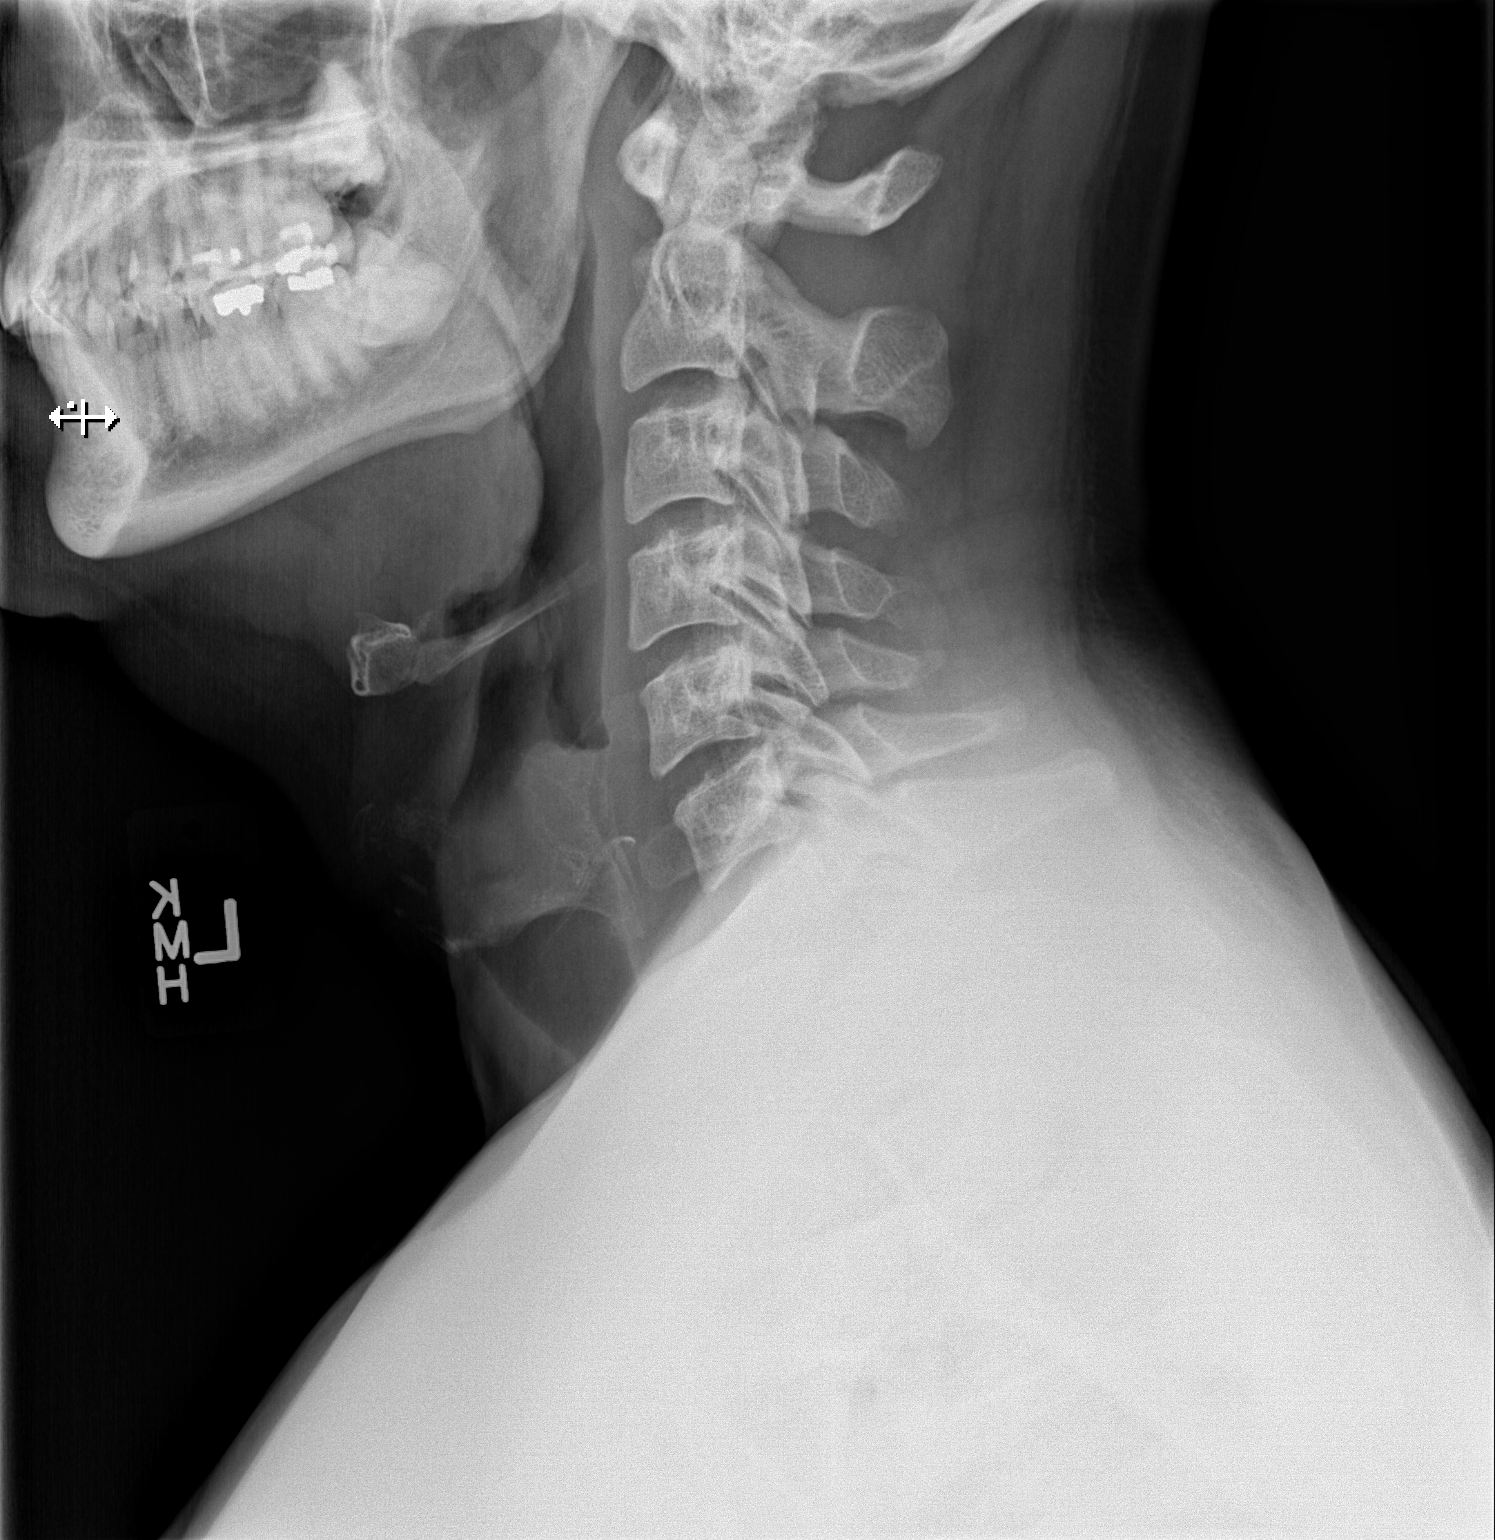

[2 of 2 positions shown; findings below may reference images not displayed]

FINDINGS: The epiglottis is poorly defined. No retropharyngeal or prevertebral
soft tissue edema. The cervical airway is unremarkable. No
radio-opaque foreign body identified.
IMPRESSION: Epiglottis is poorly defined. Suspect this is related to
positioning, however further evaluation with CT could be performed
based on clinical concern.

## 2015-01-17 MED ORDER — IBUPROFEN 600 MG PO TABS: 600.0000 mg | ORAL_TABLET | Freq: Four times a day (QID) | ORAL | Status: DC | PRN

## 2015-01-17 MED ORDER — IOHEXOL 300 MG/ML  SOLN
100.0000 mL | Freq: Once | INTRAMUSCULAR | Status: AC | PRN
  Administered 2015-01-17: 100 mL via INTRAVENOUS

## 2015-01-17 MED ORDER — PREDNISONE 20 MG PO TABS
40.0000 mg | ORAL_TABLET | Freq: Once | ORAL | Status: AC
  Administered 2015-01-17: 40 mg via ORAL
  Filled 2015-01-17: qty 2

## 2015-01-17 MED ORDER — SODIUM CHLORIDE 0.9 % IV SOLN
Freq: Once | INTRAVENOUS | Status: AC
  Administered 2015-01-17: 06:00:00 via INTRAVENOUS

## 2015-01-17 MED ORDER — ACETAMINOPHEN 325 MG PO TABS
650.0000 mg | ORAL_TABLET | Freq: Once | ORAL | Status: AC
  Administered 2015-01-17: 650 mg via ORAL
  Filled 2015-01-17: qty 2

## 2015-01-17 NOTE — ED Notes (Addendum)
Pt from home c/o sore throat x 1 day. Pt reports he feels as if his throat is swelling. Throat is red. He reports it is difficult to swallow. He took two quick chew tabs of benadryl. He reports taking gabapentin x 2 weeks. Previous medication reaction lamictal.

## 2015-01-17 NOTE — ED Notes (Signed)
Patient transported to CT 

## 2015-01-17 NOTE — Discharge Instructions (Signed)
You were evaluated in the ED for your sore throat. Your strep test was negative, your CT scan of her neck did not show any sign of infection or other emergent pathology. It is important if you to continue to take Motrin and Tylenol for your discomfort. Return to ED for any worsening symptoms.  Pharyngitis Pharyngitis is redness, pain, and swelling (inflammation) of your pharynx.  CAUSES  Pharyngitis is usually caused by infection. Most of the time, these infections are from viruses (viral) and are part of a cold. However, sometimes pharyngitis is caused by bacteria (bacterial). Pharyngitis can also be caused by allergies. Viral pharyngitis may be spread from person to person by coughing, sneezing, and personal items or utensils (cups, forks, spoons, toothbrushes). Bacterial pharyngitis may be spread from person to person by more intimate contact, such as kissing.  SIGNS AND SYMPTOMS  Symptoms of pharyngitis include:   Sore throat.   Tiredness (fatigue).   Low-grade fever.   Headache.  Joint pain and muscle aches.  Skin rashes.  Swollen lymph nodes.  Plaque-like film on throat or tonsils (often seen with bacterial pharyngitis). DIAGNOSIS  Your health care provider will ask you questions about your illness and your symptoms. Your medical history, along with a physical exam, is often all that is needed to diagnose pharyngitis. Sometimes, a rapid strep test is done. Other lab tests may also be done, depending on the suspected cause.  TREATMENT  Viral pharyngitis will usually get better in 3-4 days without the use of medicine. Bacterial pharyngitis is treated with medicines that kill germs (antibiotics).  HOME CARE INSTRUCTIONS   Drink enough water and fluids to keep your urine clear or pale yellow.   Only take over-the-counter or prescription medicines as directed by your health care provider:   If you are prescribed antibiotics, make sure you finish them even if you start to feel  better.   Do not take aspirin.   Get lots of rest.   Gargle with 8 oz of salt water ( tsp of salt per 1 qt of water) as often as every 1-2 hours to soothe your throat.   Throat lozenges (if you are not at risk for choking) or sprays may be used to soothe your throat. SEEK MEDICAL CARE IF:   You have large, tender lumps in your neck.  You have a rash.  You cough up green, yellow-brown, or bloody spit. SEEK IMMEDIATE MEDICAL CARE IF:   Your neck becomes stiff.  You drool or are unable to swallow liquids.  You vomit or are unable to keep medicines or liquids down.  You have severe pain that does not go away with the use of recommended medicines.  You have trouble breathing (not caused by a stuffy nose). MAKE SURE YOU:   Understand these instructions.  Will watch your condition.  Will get help right away if you are not doing well or get worse. Document Released: 06/05/2005 Document Revised: 03/26/2013 Document Reviewed: 02/10/2013 [facility] Patient Information 2015 [place], [place]. This information is not intended to replace advice given to you by your health care provider. Make sure you discuss any questions you have with your health care provider.  Salt Water Gargle This solution will help make your mouth and throat feel better. HOME CARE INSTRUCTIONS   Mix 1 teaspoon of salt in 8 ounces of warm water.  Gargle with this solution as much or often as you need or as directed. Swish and gargle gently if you have any sores  or wounds in your mouth.  Do not swallow this mixture. Document Released: 03/09/2004 Document Revised: 08/28/2011 Document Reviewed: 07/31/2008 [facility] Patient Information 2015 [place], [place]. This information is not intended to replace advice given to you by your health care provider. Make sure you discuss any questions you have with your health care provider.

## 2015-01-17 NOTE — ED Provider Notes (Signed)
Patient care signed out to me by Junius Creamer, NP. Please see her note for further detail on patient presentation. Plan is for follow-up on CT neck soft tissue. If negative and patient continues to maintain airway, tolerate oral fluids, may discharge home to follow-up PCP. Patient reports that her throat is still mildly sore, ease of swallowing has improved. Discussed alternating between Motrin and Tylenol at home for discomfort. No evidence of other acute or emergent pathology. CT neck is negative for any acute pathology. Filed Vitals:   01/17/15 0357 01/17/15 0400 01/17/15 0533  BP: 108/73  115/71  Pulse: 70  73  Temp:  97.8 F (36.6 C)   Resp: 18  17  SpO2: 97%  98%     Comer Locket, PA-C 01/17/15 0800  Rolland Porter, MD 01/17/15 2259

## 2015-01-17 NOTE — ED Notes (Addendum)
Pt A&Ox4. No drooling noted. Speaking full/clear sentences. RR even/unlabored. No wheezing or stridor noted. Will continue to re-assess.

## 2015-01-17 NOTE — ED Provider Notes (Signed)
CSN: [id number]     Arrival date & time 01/17/15  4098 History   First MD Initiated Contact with Patient 01/17/15 0354     Chief Complaint  Patient presents with  . Allergic Reaction     (Consider location/radiation/quality/duration/timing/severity/associated sxs/prior Treatment) HPI Comments: Patient states he's had some throat discomfort for the past 24 hours.  He woke approximately one hour ago with a very tight dry feeling in his throat.  He took Benadryl because he was afraid it was allergic reaction.  He has had a reaction to Lamictal in the past and has recently been taking Neurontin for the past 3 weeks.   Patient reports that since taking the Benadryl.  He feels slightly better  Patient is a 49 y.o. male presenting with allergic reaction. The history is provided by the patient.  Allergic Reaction Presenting symptoms: difficulty swallowing and swelling   Presenting symptoms: no difficulty breathing and no rash   Severity:  Moderate Prior allergic episodes:  Allergies to medications, food/nut allergies and seasonal allergies Context: medications   Relieved by:  Antihistamines Worsened by:  Nothing tried   History reviewed. No pertinent past medical history. History reviewed. No pertinent past surgical history. No family history on file. History  Substance Use Topics  . Smoking status: Former Research scientist (life sciences)  . Smokeless tobacco: Never Used  . Alcohol Use: Yes    Review of Systems  Constitutional: Negative for fever and chills.  HENT: Positive for sore throat and trouble swallowing. Negative for drooling.   Gastrointestinal: Negative for abdominal pain.  Genitourinary: Negative for dysuria.  Skin: Negative for rash and wound.  Neurological: Negative for dizziness, weakness, numbness and headaches.  Psychiatric/Behavioral: The patient is nervous/anxious.   All other systems reviewed and are negative.     Allergies  Bee venom and Other  Home Medications   Prior to  Admission medications   Medication Sig Start Date End Date Taking? Authorizing Provider  Ascorbic Acid (VITAMIN C PO) Take 1 tablet by mouth daily.    Historical Provider, MD  BEE POLLEN PO Take 1 tablet by mouth daily.    Historical Provider, MD  cetirizine (ZYRTEC) 10 MG tablet Take 10 mg by mouth daily as needed for allergies.    Historical Provider, MD  Cholecalciferol (VITAMIN D PO) Take 1 tablet by mouth daily.    Historical Provider, MD  CRANBERRY PO Take 1 tablet by mouth daily.    Historical Provider, MD  EPINEPHrine (EPIPEN 2-PAK IJ) Inject 1 each as directed as needed. For reaction    Historical Provider, MD  GINSENG PO Take 1 capsule by mouth daily.    Historical Provider, MD  L-ARGININE PO Take 1 capsule by mouth daily.    Historical Provider, MD  Lactobacillus (ACIDOPHILUS PO) Take 1 capsule by mouth daily.    Historical Provider, MD  LYSINE PO Take 1 capsule by mouth daily.    Historical Provider, MD  Misc Natural Products (PROSTATE SUPPORT PO) Take 1 capsule by mouth daily.    Historical Provider, MD  nitroGLYCERIN (NITRODUR - DOSED IN MG/24 HR) 0.2 mg/hr 1/4 patch daily Patient not taking: Reported on 10/17/2014 10/30/12   Lyndal Pulley, DO  Omega-3 Fatty Acids (FISH OIL PO) Take 1 capsule by mouth daily.    Historical Provider, MD  QUERCETIN PO Take 1 capsule by mouth daily.    Historical Provider, MD  Stinging Nettle 2 % POWD Take 1 capsule by mouth daily.    Historical Provider, MD  TURMERIC PO Take 1 capsule by mouth daily.    Historical Provider, MD  VITAMIN A PO Take 1 tablet by mouth daily.    Historical Provider, MD   BP 108/73 mmHg  Pulse 70  Temp(Src) 97.8 F (36.6 C)  Resp 18  SpO2 97% Physical Exam  Constitutional: He appears well-developed and well-nourished. No distress.  HENT:  Head: Normocephalic.  Mouth/Throat: Uvula is midline, oropharynx is clear and moist and mucous membranes are normal. No uvula swelling. No posterior oropharyngeal edema, posterior  oropharyngeal erythema or tonsillar abscesses.  Eyes: Pupils are equal, round, and reactive to light.  Neck: Normal range of motion.  Cardiovascular: Normal rate and regular rhythm.   Pulmonary/Chest: Effort normal and breath sounds normal.  Abdominal: Soft. Bowel sounds are normal.  Musculoskeletal: Normal range of motion. He exhibits no edema.  Lymphadenopathy:    He has no cervical adenopathy.  Neurological: He is alert.  Skin: Skin is warm and dry. No rash noted. No erythema.  Nursing note and vitals reviewed.   ED Course  Procedures (including critical care time) Labs Review Labs Reviewed  RAPID STREP SCREEN (NOT AT [facility])    Imaging Review No results found.   EKG Interpretation None     Patient's strep test is negative.  On reexamination patient states he is still having discomfort when he swallows.  He is maintaining his airway is not having any drooling.  His we will to swallow his own saliva.  I will give him some Tylenol for discomfort MDM   Final diagnoses:  None         Junius Creamer, NP 01/17/15 Delrae Rend  Rolland Porter, MD 01/17/15 [phone]

## 2015-01-18 ENCOUNTER — Emergency Department (HOSPITAL_COMMUNITY)
Admission: EM | Admit: 2015-01-18 | Discharge: 2015-01-19 | Disposition: A | Discharge status: Home / Self Care | Payer: Managed Care, Other (non HMO) | Attending: Emergency Medicine | Admitting: Emergency Medicine

## 2015-01-18 ENCOUNTER — Encounter (HOSPITAL_COMMUNITY): Payer: Self-pay | Admitting: Emergency Medicine

## 2015-01-18 DIAGNOSIS — Z791 Long term (current) use of non-steroidal anti-inflammatories (NSAID): Secondary | ICD-10-CM | POA: Diagnosis not present

## 2015-01-18 DIAGNOSIS — R21 Rash and other nonspecific skin eruption: Secondary | ICD-10-CM | POA: Diagnosis present

## 2015-01-18 DIAGNOSIS — Z79899 Other long term (current) drug therapy: Secondary | ICD-10-CM | POA: Insufficient documentation

## 2015-01-18 DIAGNOSIS — J029 Acute pharyngitis, unspecified: Secondary | ICD-10-CM

## 2015-01-18 MED ORDER — FAMOTIDINE IN NACL 20-0.9 MG/50ML-% IV SOLN
20.0000 mg | Freq: Once | INTRAVENOUS | Status: AC
  Administered 2015-01-18: 20 mg via INTRAVENOUS
  Filled 2015-01-18: qty 50

## 2015-01-18 MED ORDER — SODIUM CHLORIDE 0.9 % IV BOLUS (SEPSIS)
1000.0000 mL | Freq: Once | INTRAVENOUS | Status: AC
  Administered 2015-01-18: 1000 mL via INTRAVENOUS

## 2015-01-18 NOTE — ED Provider Notes (Signed)
CSN: [id number]     Arrival date & time 01/18/15  2054 History   First MD Initiated Contact with Patient 01/18/15 2114     Chief Complaint  Patient presents with  . Insect Bite     (Consider location/radiation/quality/duration/timing/severity/associated sxs/prior Treatment) HPI Comments: Patient seen yesterday for throat swelling and again today by his PCP for same. He states he has not been able to swallow for 3 days secondary to pain and swelling. No fever. Diagnosed with likely viral illness by his doctor today but started on Z-pack and given steroid shot "in case the strep test was wrong". He states to complicate things he accidentally took a Lamictal earlier today, a medication he has had a reaction to in the past. 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 He was, however, able to maintain normal activities over the weekend.    The history is provided by the patient. No language interpreter was used.    History reviewed. No pertinent past medical history. History reviewed. No pertinent past surgical history. No family history on file. History  Substance Use Topics  . Smoking status: Never Smoker   . Smokeless tobacco: Never Used  . Alcohol Use: 12.6 oz/week    21 Glasses of wine per week    Review of Systems  Constitutional: Negative for fever and chills.  HENT: Positive for sore throat and trouble swallowing.   Respiratory: Negative.   Cardiovascular: Negative.   Gastrointestinal: Negative.  Negative for nausea and vomiting.  Musculoskeletal: Negative.   Skin: Positive for rash.  Neurological: Negative.       Allergies  Bee venom; Other; and Lamictal  Home Medications   Prior to Admission medications   Medication Sig Start Date End Date Taking? Authorizing Provider  Ascorbic Acid (VITAMIN C PO) Take 1 tablet by mouth daily.    Historical Provider, MD  BEE POLLEN PO Take 1 tablet by mouth daily.     Historical Provider, MD  cetirizine (ZYRTEC) 10 MG tablet Take 10 mg by mouth daily as needed for allergies.    Historical Provider, MD  Cholecalciferol (VITAMIN D PO) Take 1 tablet by mouth daily.    Historical Provider, MD  CRANBERRY PO Take 1 tablet by mouth daily.    Historical Provider, MD  EPINEPHrine (EPIPEN 2-PAK IJ) Inject 1 each as directed as needed. For reaction    Historical Provider, MD  gabapentin (NEURONTIN) 100 MG capsule Take 300 mg by mouth at bedtime.    Historical Provider, MD  GINSENG PO Take 1 capsule by mouth daily.    Historical Provider, MD  ibuprofen (ADVIL,MOTRIN) 600 MG tablet Take 1 tablet (600 mg total) by mouth every 6 (six) hours as needed. 01/17/15   Comer Locket, PA-C  L-ARGININE PO Take 1 capsule by mouth daily.    Historical Provider, MD  Lactobacillus (ACIDOPHILUS PO) Take 1 capsule by mouth daily.    Historical Provider, MD  LYSINE PO Take 1 capsule by mouth daily.    Historical Provider, MD  Misc Natural Products (PROSTATE SUPPORT PO) Take 1 capsule by mouth daily.    Historical Provider, MD  nitroGLYCERIN (NITRODUR - DOSED IN MG/24 HR) 0.2 mg/hr 1/4 patch daily Patient not taking: Reported on 10/17/2014 10/30/12   Lyndal Pulley, DO  Omega-3 Fatty Acids (FISH OIL PO) Take 1 capsule by mouth daily.    Historical Provider, MD  QUERCETIN PO Take 1 capsule by  mouth daily.    Historical Provider, MD  rosuvastatin (CRESTOR) 5 MG tablet Take 5 mg by mouth daily.    Historical Provider, MD  Stinging Nettle 2 % POWD Take 1 capsule by mouth daily.    Historical Provider, MD  TURMERIC PO Take 1 capsule by mouth daily.    Historical Provider, MD  VITAMIN A PO Take 1 tablet by mouth daily.    Historical Provider, MD   BP 95/55 mmHg  Pulse 76  Temp(Src) 98.8 F (37.1 C) (Oral)  Resp 18  Ht 6\' 2"  (1.88 m)  Wt 190 lb (86.183 kg)  BMI 24.38 kg/m2  SpO2 99% Physical Exam  Constitutional: He is oriented to person, place, and time. He appears well-developed and  well-nourished. No distress.  HENT:  Head: Normocephalic.  Uvula midline. Oropharynx is erythematous with minimal exudates. No excessive tonsillar swelling.   Neck: Normal range of motion. Neck supple.  Cardiovascular: Normal rate and regular rhythm.   Pulmonary/Chest: Effort normal and breath sounds normal.  Abdominal: Soft. Bowel sounds are normal. There is no tenderness. There is no rebound and no guarding.  Musculoskeletal: Normal range of motion.  Lymphadenopathy:    He has no cervical adenopathy.  Neurological: He is alert and oriented to person, place, and time.  Skin: Skin is warm and dry. No rash noted.  Psychiatric: He has a normal mood and affect.    ED Course  Procedures (including critical care time) Labs Review Labs Reviewed - No data to display  Imaging Review Dg Neck Soft Tissue  01/17/2015   CLINICAL DATA:  Difficulty swallowing.  Sore throat for 1 day.  EXAM: NECK SOFT TISSUES - 1+ VIEW  COMPARISON:  None.  FINDINGS: The epiglottis is poorly defined. No retropharyngeal or prevertebral soft tissue edema. The cervical airway is unremarkable. No radio-opaque foreign body identified.  IMPRESSION: Epiglottis is poorly defined. Suspect this is related to positioning, however further evaluation with CT could be performed based on clinical concern.   Electronically Signed   By: Jeb Levering M.D.   On: 01/17/2015 05:45   Ct Soft Tissue Neck W Contrast  01/17/2015   CLINICAL DATA:  Initial evaluation for acute sore throat for 1 day.  EXAM: CT NECK WITH CONTRAST  TECHNIQUE: Multidetector CT imaging of the neck was performed using the standard protocol following the bolus administration of intravenous contrast.  CONTRAST:  166mL OMNIPAQUE IOHEXOL 300 MG/ML  SOLN  COMPARISON:  Prior radiograph from earlier the same day.  FINDINGS: Visualized portions of the brain demonstrate a normal appearance. Partially visualized globes and orbits are unremarkable.  Visualized paranasal sinuses  and mastoid air cells are clear. Middle ear cavities are clear.  The salivary glands including the parotid glands and submandibular glands are normal in appearance.  Oral cavity is unremarkable without evidence of mass lesion or loculated fluid collection. Palatine tonsils are symmetric and normal bilaterally. Parapharyngeal fat is preserved. Oropharynx and nasopharynx are within normal limits. Epiglottis is normal. Vallecula is clear. Mild lingual tonsil hypertrophy. No retropharyngeal fluid collection. No significant swelling or edema seen within the hypopharynx or supraglottic larynx. True vocal cords are symmetric and normal bilaterally. Subglottic airway is clear.  Thyroid gland is normal.  No pathologically enlarged lymph nodes identified within the neck.  Partially visualized superior mediastinum within normal limits.  Visualized lungs are clear.  Normal intravascular enhancement seen within the neck.  No acute osseous abnormality. No worrisome lytic or blastic osseous lesions.  IMPRESSION: Normal neck  CT.  No significant inflammatory changes identified.   Electronically Signed   By: Jeannine Boga M.D.   On: 01/17/2015 07:11     EKG Interpretation None      MDM   Final diagnoses:  None    1. Pharyngitis  The patient received IM steroids by PCP earlier today - no Rx, assume Decadron. He has been given abx and is currently taking them (PO tablets) without difficulty. He is tolerating his own secretions without spitting. No vomiting. He has requested water to drink. He is taking Benadryl and has been given Pepcid. Suspect symptoms are due to viral sore throat, not allergic symptoms. No evidence to support airway compromise. He has been able to maintain normal activity including playing baseball (2 games) yesterday.  The patient is reassured by both myself and Dr. Rex Kras who has seen and examined the patient. He has been given IV fluids for comfort. He is felt appropriate for discharge  home.     Charlann Lange, PA-C 01/18/15 [place], PA-C 01/18/15 [place], MD 01/19/15 [phone]

## 2015-01-18 NOTE — ED Notes (Signed)
Patient arrived to ED via EMS. EMS reports: Patient had bee sting this evening in right forearm. Localized swelling noted. Patient had taken Benadryl 25 mg x 2 prior to EMS arrival. Ice pack applied to area by EMS. Patient also reports that he has been having URI x 2-3 days, and has been having difficulty swallowing/breathing. He also reports he is allergic to Lamictal, but accidentally took 100 mg today resulting in rash to upper thighs. VSS - BP 110/71, Pulse 88, SPO2 98%. Speaking in full sentences. 18 gauge in L hand. Saline lock.

## 2015-01-18 NOTE — ED Notes (Signed)
Charlann Lange, PA at bedside with patient and family at this time.

## 2015-01-18 NOTE — ED Notes (Signed)
Dr. Rex Kras at bedside with patient at this time.

## 2015-01-18 NOTE — ED Notes (Addendum)
Pt's shirt, wallet and watch placed in Pt belonging bag. Pt cell phone given to Pt and shoes at bedside

## 2015-01-18 NOTE — Discharge Instructions (Signed)
Salt Water Gargle This solution will help make your mouth and throat feel better. HOME CARE INSTRUCTIONS   Mix 1 teaspoon of salt in 8 ounces of warm water.  Gargle with this solution as much or often as you need or as directed. Swish and gargle gently if you have any sores or wounds in your mouth.  Do not swallow this mixture. Document Released: 03/09/2004 Document Revised: 08/28/2011 Document Reviewed: 07/31/2008 [facility] Patient Information 2015 [place], [place]. This information is not intended to replace advice given to you by your health care provider. Make sure you discuss any questions you have with your health care provider. Pharyngitis Pharyngitis is redness, pain, and swelling (inflammation) of your pharynx.  CAUSES  Pharyngitis is usually caused by infection. Most of the time, these infections are from viruses (viral) and are part of a cold. However, sometimes pharyngitis is caused by bacteria (bacterial). Pharyngitis can also be caused by allergies. Viral pharyngitis may be spread from person to person by coughing, sneezing, and personal items or utensils (cups, forks, spoons, toothbrushes). Bacterial pharyngitis may be spread from person to person by more intimate contact, such as kissing.  SIGNS AND SYMPTOMS  Symptoms of pharyngitis include:   Sore throat.   Tiredness (fatigue).   Low-grade fever.   Headache.  Joint pain and muscle aches.  Skin rashes.  Swollen lymph nodes.  Plaque-like film on throat or tonsils (often seen with bacterial pharyngitis). DIAGNOSIS  Your health care provider will ask you questions about your illness and your symptoms. Your medical history, along with a physical exam, is often all that is needed to diagnose pharyngitis. Sometimes, a rapid strep test is done. Other lab tests may also be done, depending on the suspected cause.  TREATMENT  Viral pharyngitis will usually get better in 3-4 days without the use of medicine. Bacterial  pharyngitis is treated with medicines that kill germs (antibiotics).  HOME CARE INSTRUCTIONS   Drink enough water and fluids to keep your urine clear or pale yellow.   Only take over-the-counter or prescription medicines as directed by your health care provider:   If you are prescribed antibiotics, make sure you finish them even if you start to feel better.   Do not take aspirin.   Get lots of rest.   Gargle with 8 oz of salt water ( tsp of salt per 1 qt of water) as often as every 1-2 hours to soothe your throat.   Throat lozenges (if you are not at risk for choking) or sprays may be used to soothe your throat. SEEK MEDICAL CARE IF:   You have large, tender lumps in your neck.  You have a rash.  You cough up green, yellow-brown, or bloody spit. SEEK IMMEDIATE MEDICAL CARE IF:   Your neck becomes stiff.  You drool or are unable to swallow liquids.  You vomit or are unable to keep medicines or liquids down.  You have severe pain that does not go away with the use of recommended medicines.  You have trouble breathing (not caused by a stuffy nose). MAKE SURE YOU:   Understand these instructions.  Will watch your condition.  Will get help right away if you are not doing well or get worse. Document Released: 06/05/2005 Document Revised: 03/26/2013 Document Reviewed: 02/10/2013 [facility] Patient Information 2015 [place], [place]. This information is not intended to replace advice given to you by your health care provider. Make sure you discuss any questions you have with your health care provider.

## 2015-01-18 NOTE — ED Notes (Signed)
240 ml cup of warm salt water provided to patient at bedside. RN observed patient gargling with warm salt water and spitting into another cup. Also, provided room temperature cup of water - per request. No acute distress noted during gargling.

## 2015-01-19 LAB — CULTURE, GROUP A STREP: Strep A Culture: NEGATIVE

## 2015-01-22 ENCOUNTER — Ambulatory Visit (INDEPENDENT_AMBULATORY_CARE_PROVIDER_SITE_OTHER): Payer: Managed Care, Other (non HMO)

## 2015-01-22 ENCOUNTER — Ambulatory Visit (INDEPENDENT_AMBULATORY_CARE_PROVIDER_SITE_OTHER): Payer: Managed Care, Other (non HMO) | Admitting: Podiatry

## 2015-01-22 ENCOUNTER — Encounter: Payer: Self-pay | Admitting: Podiatry

## 2015-01-22 VITALS — BP 122/75 | HR 52 | Resp 15

## 2015-01-22 DIAGNOSIS — M779 Enthesopathy, unspecified: Secondary | ICD-10-CM | POA: Diagnosis not present

## 2015-01-22 DIAGNOSIS — S93602A Unspecified sprain of left foot, initial encounter: Secondary | ICD-10-CM

## 2015-01-22 DIAGNOSIS — M2042 Other hammer toe(s) (acquired), left foot: Secondary | ICD-10-CM | POA: Diagnosis not present

## 2015-01-22 MED ORDER — TRIAMCINOLONE ACETONIDE 10 MG/ML IJ SUSP
10.0000 mg | Freq: Once | INTRAMUSCULAR | Status: AC
  Administered 2015-01-22: 10 mg

## 2015-01-22 NOTE — Progress Notes (Signed)
   Subjective:    Patient ID: Francisco Phillips, male    DOB: [birth year], 49 y.o.   MRN: [id number]  HPI  Pt presents with painful knot on the lateral side of his left foot, with swelling. He states that the knot had been there for about a year but has gotten larger and increased in pain  Review of Systems  HENT: Positive for sore throat.   All other systems reviewed and are negative.      Objective:   Physical Exam        Assessment & Plan:

## 2015-01-25 NOTE — Progress Notes (Signed)
Subjective:     Patient ID: Jacelyn Grip, male   DOB: 13-Jan-1966, 48 y.o.   MRN: [id number]  HPI patient states I been getting a lot of pain in the outside of my left foot and it's making increasingly difficult for me to be active or to do sports   Review of Systems  All other systems reviewed and are negative.      Objective:   Physical Exam  Constitutional: He is oriented to person, place, and time.  Cardiovascular: Intact distal pulses.   Musculoskeletal: Normal range of motion.  Neurological: He is oriented to person, place, and time.  Skin: Skin is warm.  Nursing note and vitals reviewed.  neurovascular status intact muscle strength adequate range of motion within normal limits with inflammation and pain on the lateral side of the left foot at the base of the fifth metatarsal with fluid buildup. I checked the tendon and did not note a tear of the tendon at this time     Assessment:     Acute peroneal tendinitis base of fifth metatarsal left    Plan:     H&P and x-ray of condition reviewed. Careful injection of the lateral base of the peroneal and fifth metatarsal 3 mg dexamethasone Kenalog 5 mg Xylocaine advised on aggressive ice therapy and dispensed fascial brace with instructions on usage

## 2017-04-24 ENCOUNTER — Encounter: Payer: Self-pay | Admitting: Genetic Counselor

## 2017-05-28 ENCOUNTER — Other Ambulatory Visit: Payer: Self-pay

## 2017-05-28 ENCOUNTER — Encounter: Payer: Self-pay | Admitting: Genetic Counselor

## 2017-05-29 ENCOUNTER — Telehealth: Payer: Self-pay | Admitting: Genetic Counselor

## 2017-05-29 NOTE — Telephone Encounter (Signed)
LM on VM that I wanted to r/s his appointment that was cancelled on Monday.  Left CB instructions.

## 2017-06-02 ENCOUNTER — Telehealth: Payer: Self-pay | Admitting: Genetic Counselor

## 2017-06-02 NOTE — Telephone Encounter (Signed)
LVM for upcoming genetics appoitnment

## 2017-06-21 ENCOUNTER — Telehealth: Payer: Self-pay | Admitting: Genetic Counselor

## 2017-06-21 NOTE — Telephone Encounter (Signed)
Left message about change in Genetics appointment, email and letter also sent to patient

## 2017-06-22 ENCOUNTER — Encounter: Payer: Self-pay | Admitting: Internal Medicine

## 2017-07-05 ENCOUNTER — Other Ambulatory Visit: Payer: Self-pay

## 2017-07-05 ENCOUNTER — Encounter: Payer: Self-pay | Admitting: Genetic Counselor

## 2017-07-12 ENCOUNTER — Other Ambulatory Visit: Payer: Self-pay

## 2019-02-28 ENCOUNTER — Other Ambulatory Visit: Payer: Self-pay

## 2019-02-28 DIAGNOSIS — Z20822 Contact with and (suspected) exposure to covid-19: Secondary | ICD-10-CM

## 2019-03-02 LAB — NOVEL CORONAVIRUS, NAA: SARS-CoV-2, NAA: NOT DETECTED

## 2019-06-27 ENCOUNTER — Telehealth: Payer: Self-pay | Admitting: Genetic Counselor

## 2019-06-27 NOTE — Telephone Encounter (Signed)
A genetic counseling appt has been scheduled for Mr. Moya to see Roma Kayser on 12/13 at 9am for a mychart video visit. I verified that Mr. Bellerive has a Engineer, civil (consulting) as well as his email address.

## 2019-07-01 ENCOUNTER — Encounter: Payer: Self-pay | Admitting: Genetic Counselor

## 2019-07-02 ENCOUNTER — Inpatient Hospital Stay: Payer: Self-pay | Attending: Genetic Counselor | Admitting: Genetic Counselor

## 2019-07-02 ENCOUNTER — Encounter: Payer: Self-pay | Admitting: Genetic Counselor

## 2019-07-02 DIAGNOSIS — Z800 Family history of malignant neoplasm of digestive organs: Secondary | ICD-10-CM | POA: Insufficient documentation

## 2019-07-02 DIAGNOSIS — Z803 Family history of malignant neoplasm of breast: Secondary | ICD-10-CM | POA: Insufficient documentation

## 2019-07-02 DIAGNOSIS — Z8349 Family history of other endocrine, nutritional and metabolic diseases: Secondary | ICD-10-CM

## 2019-07-02 NOTE — Progress Notes (Signed)
REFERRING PROVIDER: Deland Pretty, MD [address] [place],  [place] [address]  PRIMARY PROVIDER:  Deland Pretty, MD  PRIMARY REASON FOR VISIT:  1. Family history of pancreatic cancer   2. Family history of breast cancer in male   3. Family history of cystic fibrosis      HISTORY OF PRESENT ILLNESS:   [name], a 54 y.o. male, was seen for a [place] cancer genetics consultation at the request of Dr. Shelia Media due to a family history of pancreatic cancer and male breast cancer.  Francisco Phillips presents to clinic today to discuss the possibility of a hereditary predisposition to cancer, genetic testing, and to further clarify his future cancer risks, as well as potential cancer risks for family members.   Francisco Phillips is a 54 y.o. male with no personal history of cancer.  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    CANCER HISTORY:  Oncology History   No history exists.     Past Medical History:  Diagnosis Date  . Family history of breast cancer in male   . Family history of cystic fibrosis   . Family history of pancreatic cancer     No past surgical history on file.  Social History   Socioeconomic History  . Marital status: Married    Spouse name: Not on file  . Number of children: Not on file  . Years of education: Not on file  . Highest education level: Not on file  Occupational History  . Not on file  Tobacco Use  . Smoking status: Never Smoker  . Smokeless tobacco: Never Used  Substance and Sexual Activity  . Alcohol use: Yes    Alcohol/week: 21.0 standard drinks    Types: 21 Glasses of wine per week  . Drug use: No  . Sexual activity: Yes  Other Topics Concern  . Not on file  Social History Narrative  . Not on file   Social Determinants of Health   Financial Resource Strain:   . Difficulty of Paying Living Expenses: Not on file  Food Insecurity:   . Worried About Charity fundraiser in the Last Year:  Not on file  . Ran Out of Food in the Last Year: Not on file  Transportation Needs:   . Lack of Transportation (Medical): Not on file  . Lack of Transportation (Non-Medical): Not on file  Physical Activity:   . Days of Exercise per Week: Not on file  . Minutes of Exercise per Session: Not on file  Stress:   . Feeling of Stress : Not on file  Social Connections:   . Frequency of Communication with Friends and Family: Not on file  . Frequency of Social Gatherings with Friends and Family: Not on file  . Attends Religious Services: Not on file  . Active Member of Clubs or Organizations: Not on file  . Attends Archivist Meetings: Not on file  . Marital Status: Not on file     FAMILY HISTORY:  We obtained a detailed, 4-generation family history.  Significant diagnoses are listed below: Family History  Problem Relation Age of Onset  . Pancreatic cancer Mother 76  . Breast cancer Father 29  . Pancreatic cancer Maternal Grandmother 63  . Pancreatic cancer Maternal Uncle 61  . Dementia Paternal Grandfather     The patient has three sons who are cancer free.  He has a brother and sister  who are cancer free.  His sister had a daughter who died of Cystic Fibrosis at age 69.  His mother is deceased and his father is living.  The patient's mother had pancreatic cancer at age 70.  She had a brother and sister.  Her sister died before age 73 from unknown reasons and her brother died of pancreatic cancer at 64.  The maternal grandparents are deceased.  The grandmother also died of pancreatic cancer in her 35's.  The patient's father was diagnosed with breast cancer at 44.  It is unknown if he had genetic testing.  He has two sister and a brother who reportedly are cancer free.  His parents are deceased from non-cancer related issue.    Francisco Phillips is unaware of previous family history of genetic testing for hereditary cancer risks. Patient's maternal ancestors are of [place] descent, and  paternal ancestors are of [place] descent. There is no reported Ashkenazi Jewish ancestry. There is no known consanguinity.    GENETIC COUNSELING ASSESSMENT: [name] is a 54 y.o. male with a family history of pancreatic cancer and male breast cancer which is somewhat suggestive of a hereditary cancer syndrome and predisposition to cancer given the rarity of male breast cancer and the number of people with pancreatic cancer. We, therefore, discussed and recommended the following at today's visit.   DISCUSSION: We discussed that 15 - 20% of pancreatic cancer is hereditary, with most cases associated with BRCA mutation.  There are several genes that can play a role in the development of pancreatic cancer, some due to causing pancreatitis.  The CF gene (CFTR) is a gene that can cause chronic pancreatitis which can lead to pancreatic cancer.  In further discussion, the CF seems to be coming from his father's side of the family and not the maternal side.  Therefore, we would be suspicious of other high risk pancreatic cancer genes.    Male breast cancer, as seen in his father, is rare.  The risk for a man to have breat cancer is approximately 1 in 1000.  The chance for a man who has breast cancer to have a hereditary mutation causing his breast cancer is approximately 2 out of 3.  Most commonly this is a BRCA mutation, however there are other genes that can cause male breast cancer.  We discussed that testing is beneficial for several reasons including knowing how to follow individuals after completing their treatment,and understand if other family members could be at risk for cancer and allow them to undergo genetic testing.   We reviewed the characteristics, features and inheritance patterns of hereditary cancer syndromes. We also discussed genetic testing, including the appropriate family members to test, the process of testing, insurance coverage and turn-around-time for results. We discussed the  implications of a negative, positive, carrier and/or variant of uncertain significant result. We recommended Mr. Nihiser pursue genetic testing for the common hereditary cancer gene panel to include the preliminary pancreatic cancer genes.  Based on Mr. Linck's family history of cancer, he meets medical criteria for genetic testing. Despite that he meets criteria, he may still have an out of pocket cost. We discussed that if his out of pocket cost for testing is over $100, the laboratory will call and confirm whether he wants to proceed with testing.  If the out of pocket cost of testing is less than $100 he will be billed by the genetic testing laboratory.   PLAN: After considering the risks, benefits, and limitations, Mr. Eskew  provided informed consent to pursue genetic testing.  A saliva kit will be sent in the mail and Mr. Birt will be responsible for sending it [place] for analysis of the common hereditary cancer panel and the preliminary pancreatic cancer genes. Results should be available within approximately 2-3 weeks' time, at which point they will be disclosed by telephone to Mr. Lumadue, as will any additional recommendations warranted by these results. Mr. Monestime will receive a summary of his genetic counseling visit and a copy of his results once available. This information will also be available in Epic.   Lastly, we encouraged Mr. Clift to remain in contact with cancer genetics annually so that we can continuously update the family history and inform him of any changes in cancer genetics and testing that may be of benefit for this family.   Mr. Gervasi questions were answered to his satisfaction today. Our contact information was provided should additional questions or concerns arise. Thank you for the referral and allowing [place] to share in the care of your patient.   Lillyonna Armstead P. [place]ne [place], [place], [facility] Licensed, Insurance risk surveyor [email or web address] phone:  [phone]  The patient was seen for a total of 45 minutes in face-to-face genetic counseling.  This patient was discussed with Drs. Magrinat, Lindi Adie and/or Burr Medico who agrees with the above.    _______________________________________________________________________ For Office Staff:  Number of people involved in session: 1 Was an Intern/ student involved with case: no

## 2019-07-08 ENCOUNTER — Telehealth: Payer: Self-pay | Admitting: Genetic Counselor

## 2019-07-08 NOTE — Telephone Encounter (Signed)
Patient stated that he will send a copy of his insurance card either today or tomorrow to my email.  Discussed his eligibility for pancreatic cancer screening.

## 2019-07-08 NOTE — Telephone Encounter (Signed)
3rd attempt to reach patient to get updated copy of insurance card.  Left CB instructions.

## 2019-08-18 HISTORY — PX: UPPER GASTROINTESTINAL ENDOSCOPY: SHX188

## 2019-08-26 ENCOUNTER — Telehealth: Payer: Self-pay | Admitting: Genetic Counselor

## 2019-08-26 ENCOUNTER — Encounter: Payer: Self-pay | Admitting: Genetic Counselor

## 2019-08-26 DIAGNOSIS — Z1379 Encounter for other screening for genetic and chromosomal anomalies: Secondary | ICD-10-CM | POA: Insufficient documentation

## 2019-08-26 NOTE — Telephone Encounter (Signed)
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  CF is paternally derived.  The patient's father had breast cancer.  No pathogenic variants to explain that cancer either.  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

## 2019-08-27 ENCOUNTER — Ambulatory Visit: Payer: Self-pay | Admitting: Genetic Counselor

## 2019-08-27 DIAGNOSIS — Z1379 Encounter for other screening for genetic and chromosomal anomalies: Secondary | ICD-10-CM

## 2019-08-27 NOTE — Progress Notes (Signed)
HPI:  Mr. Shehan was previously seen in the [place] clinic due to a family history of male breast cancer and pancreatic cancer and concerns regarding a hereditary predisposition to cancer. Please refer to our prior cancer genetics clinic note for more information regarding our discussion, assessment and recommendations, at the time. Mr. Hanson recent genetic test results were disclosed to him, as were recommendations warranted by these results. These results and recommendations are discussed in more detail below.  CANCER HISTORY:  Oncology History   No history exists.    FAMILY HISTORY:  We obtained a detailed, 4-generation family history.  Significant diagnoses are listed below: Family History  Problem Relation Age of Onset  . Pancreatic cancer Mother 57  . Breast cancer Father 65  . Pancreatic cancer Maternal Grandmother 64  . Pancreatic cancer Maternal Uncle 61  . Dementia Paternal Grandfather     The patient has three sons who are cancer free.  He has a brother and sister who are cancer free.  His sister had a daughter who died of Cystic Fibrosis at age 82.  His mother is deceased and his father is living.  The patient's mother had pancreatic cancer at age 37.  She had a brother and sister.  Her sister died before age 69 from unknown reasons and her brother died of pancreatic cancer at 7.  The maternal grandparents are deceased.  The grandmother also died of pancreatic cancer in her 19's.  The patient's father was diagnosed with breast cancer at 51.  It is unknown if he had genetic testing.  He has two sister and a brother who reportedly are cancer free.  His parents are deceased from non-cancer related issue.    Mr. Davydov is unaware of previous family history of genetic testing for hereditary cancer risks. Patient's maternal ancestors are of [place] descent, and paternal ancestors are of [place] descent. There is no reported Ashkenazi Jewish ancestry. There is no  known consanguinity.     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 CFTR is associated with autosomal recessive cystic fibrosis and congenital bilateral absence of the vas deferens, as well as an increase risk for chronic pancreatitis in heterozygous carriers.  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 The test report has been scanned into EPIC and is located under the Molecular Pathology section of the Results Review tab.  A portion of the result report is included below for reference.     We discussed with Mr. Rabara that because current genetic testing is not perfect, it is possible there may be a gene mutation in one of these genes that current testing cannot detect, but that chance is small.  We also discussed, that there could be another gene that has not yet been discovered, or that we have not yet tested, that is responsible for the cancer diagnoses in the family. It is also possible there is a hereditary cause for the cancer in the family that Mr. Atilano did not inherit and therefore was not identified in his testing.  Therefore, it is important to remain in touch with cancer genetics in the future so that we can continue to offer Mr. Dave the most up to date genetic testing.   Genetic testing did identify a variant of uncertain significance (  VUS) was identified in the CDH1 gene called c.535A>G.  At this time, it is unknown if this variant is associated with increased cancer risk or if this is a normal finding, but most variants such as this get reclassified to being inconsequential. It should not be used to make medical management decisions. With time, we suspect the lab will determine the significance of this variant, if any. If we do learn more about it,  we will try to contact Mr. Millman to discuss it further. However, it is important to stay in touch with [place] periodically and keep the address and phone number up to date.  CLINICAL CONDITION: The CFTR gene is associated with autosomal recessive cystic fibrosis (MedGen UID: 09811). Other CFTR-related disorders include congenital bilateral absence of the vas deferens (CBAVD; MedGen UID: 91478) and an increased risk for pancreatitis (PMID: ON:7616720, SB:9536969).  Cystic fibrosis (CF) is characterized by the buildup of mucus that can damage the digestive, respiratory, and reproductive systems (PMID: PZ:3016290). The severity of CF ranges from mild to severe and there is significant variability in presentation, even within a family (PMID: PZ:3016290, PH:9248069, KL:3439511). Symptoms include chronic cough, recurring chest colds, wheezing, shortness of breath, recurring sinus infections, excessive sweating, and poor growth. In classic CF, symptoms begin in early childhood, with average life expectancy in the 21's. As the disease progresses, mucus buildup and recurring infections lead to irreparable lung damage and pulmonary complications, which are considered to be the primary cause of mortality (Cystic Fibrosis Foundation. http://www.anderson-foster.com/. ?Accessed July 17, 2016). In addition to pulmonary disease, around 80-90% of individuals with classic CF have pancreatic insufficiency, which causes digestive problems, malnutrition, and poor growth (PMID: ZI:4380089). Other symptoms include meconium ileus at birth (~20% of affected newborns), diabetes (20% of adolescents; 40-50% of adults), liver disease (5-10% of affected individuals in the first decade of life), and male infertility (>95% of affected males; PMID: [phone], BE:8256413, SR:3134513).  In mild cases of CF, symptoms may present later in life, with less severity, and may not impact life expectancy (PMID: EA:333527).  CBAVD is associated with male infertility and refers to  bilateral hypoplasia or aplasia of the vas deferens and seminal vesicles. Men are typically azoospermic but have normal testicular function and spermatogenesis, and are therefore able to have biological children with assisted reproductive technology (PMID: VM:7630507).  Hereditary pancreatitis is characterized by recurring episodes of acute inflammation of the pancreas beginning in childhood or adolescence and leading to chronic pancreatitis. The clinical presentation is highly variable and may include chronic abdominal pain, impairment of endocrine and exocrine pancreatic function, nausea and vomiting, maldigestion, and diabetes YM:[id number]). Pathogenic variants in CFTR may confer an approximately four- to ten-fold increased risk for chronic pancreatitis in heterozygous carriers. Other genetic and environmental risk factors are also known to modify this risk (PMID: MP:5493752, BJ:5393301, LJ:922322, AW:7020450, MF:1444345, ZA:3463862, SB:9536969). Chronic pancreatitis is a risk factor for pancreatic cancer (PMID: WV:2069343).  GENE INFORMATION: The CFTR gene, located on chromosome 7, encodes the cystic fibrosis transmembrane (CFTR) conductance regulator. This protein functions as a channel across the membrane of cells that produce mucus, sweat, saliva, tears, and digestive enzymes. The channel transports negatively-charged chloride ions into and out of cells, which helps to control the movement of water in tissues, and is necessary for the production of thin, freely flowing mucus lining the airways, digestive system, reproductive system, and other organs and tissues. The CFTR protein also regulates the function of other channels, such as those that transport positively-charged sodium ions,  which are necessary for the normal function of various organs (e.g., the lungs and pancreas). Variants in CFTR can cause the absence of the protein or a protein that doesn't function properly LF:9152166, XT:3432320).  INHERITANCE:  Cystic fibrosis and CBAVD exhibit autosomal recessive inheritance, and the affected individuals have two pathogenic variants-one in each copy of their CFTR genes. Affected individuals will pass one pathogenic CFTR variant to all of their children. For partners who each carry a pathogenic variant in CFTR, the risk of having have an affected child is 25% (per pregnancy).  The increased risk for hereditary pancreatitis follows an autosomal dominant pattern of inheritance. This means that an individual with a single pathogenic variant has a 50% chance of passing along that variant and the increased disease risk to their offspring.  Family members of individuals with pathogenic CFTR variants may consider genetic testing, as they may be carriers as well.  MANAGEMENT OF CARRIERS: Treatment for hereditary pancreatitis primarily focuses on pain management, maldigestion, and monitoring for diabetes and pancreatic cancer (PMID: FN:7837765). Adhering to a low-fat diet, eating small and frequent meals, taking enzyme supplements, keeping hydrated, and avoiding alcohol and smoking are advised (PMID: FN:7837765). In some cases, surgery (including total pancreatectomy with islet autotransplantation) may be warranted (PMID: AK:4744417). Specific recommendations exist for the treatment of pancreatic diabetes (PMID: LA:3849764).  Carriers are also at an increased risk of having a child affected with CF. For those of reproductive age, partners of known carriers should consider genetic testing to determine their reproductive risk. Around 1 in 75 people in the [place] are carriers of CF ([place]. http://www.anderson-foster.com/. ?Accessed July 17, 2016); however, this risk varies slightly based on ethnicity. Reproductive options are available for at-risk carrier couples, including prenatal diagnosis, IVF with preimplantation genetic diagnosis (PGD), gamete donation, and adoption. Additionally, newborn screening for CF is available in every  state.  ADDITIONAL GENETIC TESTING: We discussed with Mr. Loehrer that his genetic testing was fairly extensive.  If there are genes identified to increase cancer risk that can be analyzed in the future, we would be happy to discuss and coordinate this testing at that time.    CANCER SCREENING RECOMMENDATIONS: Mr. 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  However, the CF variant is paternally derived, and does not explain the pancreatic cancer on the maternal side of the family.  While CF carriers are at risk for chronic pancreatitis, which can result in pancreatic cancer, we have not identified a hereditary cause for his maternal family history of pancreatic cancer at this time. Most cancers happen by chance and this negative test suggests that his cancer may fall into this category.    While reassuring, this does not definitively rule out a hereditary predisposition to cancer. It is still possible that there could be genetic mutations that are undetectable by current technology. There could be genetic mutations in genes that have not been tested or identified to increase cancer risk.  Therefore, it is recommended he continue to follow the cancer management and screening guidelines provided by his primary healthcare provider.   An individual's cancer risk and medical management are not determined by genetic test results alone. Overall cancer risk assessment incorporates additional factors, including personal medical history, family history, and any available genetic information that may result in a personalized plan for cancer prevention and surveillance.  Based on his family history of pancreatic cancer, Mr. Rockford meets CAP criteria for pancreatic cancer screening.  Mr.  Bicknell is interested in discussing this.  Therefore, he has been referred to Dr. Gerrit Heck to discus pancreatic cancer screening.  RECOMMENDATIONS FOR FAMILY MEMBERS:  Individuals in this family  might be at some increased risk of developing cancer, over the general population risk, simply due to the family history of cancer. It is important that all of Mr. Milke's relatives (both men and women) know of the presence of the CF gene mutation. Site-specific genetic testing can sort out who in the family is at risk and who is not.   Mr. Howey children are have a 50% chance to have inherited this mutation. However, they are relatively young and this will not be of any consequence to them for several years. We do not test children because there is no risk to them until they are adults. We recommend they have genetic counseling and testing by the time they are in their early 20's.    We recommended women in this family have a yearly mammogram beginning at age 84, or 79 years younger than the earliest onset of cancer, an annual clinical breast exam, and perform monthly breast self-exams. Women in this family should also have a gynecological exam as recommended by their primary provider. All family members should have a colonoscopy by age 61.  It is also possible there is a hereditary cause for the cancer in Mr. Inda's family that he did not inherit and therefore was not identified in him.  Based on Mr. Librizzi's family history, we recommended his father, who was diagnosed with breast cancer, have genetic counseling and testing. Mr. Conto will let us know if we can be of any assistance in coordinating genetic counseling and/or testing for this family member.   FOLLOW-UP: Lastly, we discussed with Mr. Pebbles that cancer genetics is a rapidly advancing field and it is possible that new genetic tests will be appropriate for him and/or his family members in the future. We encouraged him to remain in contact with cancer genetics on an annual basis so we can update his personal and family histories and let him know of advances in cancer genetics that may benefit this family.   Our contact number was  provided. Mr. Reitmeier questions were answered to his satisfaction, and he knows he is welcome to call us at anytime with additional questions or concerns.   Roma Kayser, [place], [facility] Licensed, Certified Genetic Counselor [email or web address]

## 2019-08-28 NOTE — Progress Notes (Signed)
Spoke to patient. Appointment scheduled for later this month for pancreatic cancer screening.

## 2019-09-03 ENCOUNTER — Ambulatory Visit (INDEPENDENT_AMBULATORY_CARE_PROVIDER_SITE_OTHER): Payer: 59 | Admitting: Gastroenterology

## 2019-09-03 ENCOUNTER — Encounter: Payer: Self-pay | Admitting: Gastroenterology

## 2019-09-03 ENCOUNTER — Other Ambulatory Visit: Payer: Self-pay

## 2019-09-03 VITALS — BP 124/78 | HR 75 | Temp 97.3°F | Ht 75.0 in | Wt 194.4 lb

## 2019-09-03 DIAGNOSIS — R1310 Dysphagia, unspecified: Secondary | ICD-10-CM | POA: Diagnosis not present

## 2019-09-03 DIAGNOSIS — Z800 Family history of malignant neoplasm of digestive organs: Secondary | ICD-10-CM

## 2019-09-03 DIAGNOSIS — Z01818 Encounter for other preprocedural examination: Secondary | ICD-10-CM

## 2019-09-03 DIAGNOSIS — R748 Abnormal levels of other serum enzymes: Secondary | ICD-10-CM

## 2019-09-03 DIAGNOSIS — K219 Gastro-esophageal reflux disease without esophagitis: Secondary | ICD-10-CM | POA: Diagnosis not present

## 2019-09-03 NOTE — Progress Notes (Signed)
Chief Complaint: Pancreatic cancer screening, family history of pancreatic cancer  Referring Provider:     Clarene Phillips [facility])   HPI:    Francisco Phillips is a 54 y.o. male referred to the Gastroenterology Clinic for evaluation of Pancreatic Cancer screening due to strong family history of Pancreatic Cancer. He was recently seen by Francisco Phillips in the [facility] and referred to the [facility] clinic.  Family history notable for the following: -Mother: Pancreatic cancer, age 44. Deceased -Father: Breast cancer, age 76 -Maternal grandmother: Pancreatic cancer, age 33 -Maternal uncle: Pancreatic cancer, age 54 -His 3 sons, his brother, and his sister are all cancer free. -Sister's daughter (his niece) with cystic fibrosis. 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  An additional variant of uncertain significance (VUS) identified in the [facility] gene called c.535A>G.  He does endorse occasional solid food dysphagia along with HB/regurgitation. Will treat reflux symptoms with Tums and occasional omeprazole OTC. Points to upper sternum as site of dysphagia.  Worse with meat, pasta.  No food impactions.  Can generally flush with fluids. Has had to regurgitate. EGD with dilation years ago by Francisco Phillips at [facility].   He is otherwise without any Phillips symptoms.  Weight stable, no night sweats, no n/v/f/c/d/c.  Colonoscopy years ago and ColoGuard since then. ColoGuard earlier this year was negative.  Separately, states that recent labs notable for elevated liver enzymes.  Labs not available for review today, but has referral in place to Dr. Watt Phillips at [facility] for evaluation of elevated liver enzymes.  Potentially related to EtOH consumption during pandemic.   Past Medical History:  Diagnosis Date  . Family history of breast cancer in male   . Family history of cystic fibrosis   . Family history of pancreatic cancer       Past Surgical History:  Procedure Laterality Date  . COLONOSCOPY     With Francisco Phillips   . ESOPHAGOGASTRODUODENOSCOPY     Dilation Francisco Phillips with Francisco Phillips long time ago   Family History  Problem Relation Age of Onset  . Pancreatic cancer Mother 30  . Breast cancer Father 11  . Pancreatic cancer Maternal Grandmother 50  . Pancreatic cancer Maternal Uncle 61  . Dementia Paternal Grandfather   . Colon cancer Neg Hx   . Esophageal cancer Neg Hx    Social History   Tobacco Use  . Smoking status: Never Smoker  . Smokeless tobacco: Never Used  Substance Use Topics  . Alcohol use: Yes    Alcohol/week: 21.0 standard drinks    Types: 21 Glasses of wine per week  . Drug use: No   Current Outpatient Medications  Medication Sig Dispense Refill  . Ascorbic Acid (VITAMIN C PO) Take 1 tablet by mouth daily.    . Lactobacillus (ACIDOPHILUS PO) Take 1 capsule by mouth daily.    . Liver Extract (LIVER PO) Take 1 tablet by mouth daily.    Marland Kitchen LYSINE PO Take 1 capsule by mouth daily.    . Multiple Vitamins-Minerals (IMMUNE SUPPORT PO) Take 1 tablet by mouth daily.    . Omega-3 Fatty Acids (FISH OIL PO) Take 1 capsule by mouth daily.    . rosuvastatin (CRESTOR) 5 MG tablet Take 5 mg by mouth daily.    . Tadalafil (CIALIS PO) Take 1 tablet by mouth as needed.    Marland Kitchen  TURMERIC PO Take 1 capsule by mouth daily.    Marland Kitchen EPINEPHrine (EPIPEN 2-PAK IJ) Inject 1 each as directed as needed. For reaction     No current facility-administered medications for this visit.   Allergies  Allergen Reactions  . Bee Venom Anaphylaxis, Hives and Rash  . Other Anaphylaxis, Hives and Rash    Kiwi,walnuts,spinach,scallops,poppey seeds  . Lamictal [Lamotrigine] Rash     Review of Systems: All systems reviewed and negative except where noted in HPI.     Physical Exam:    Wt Readings from Last 3 Encounters:  09/03/19 194 lb 6 oz (88.2 kg)  01/18/15 190 lb (86.2 kg)   10/30/12 195 lb (88.5 kg)    BP 124/78   Pulse 75   Temp (!) 97.3 F (36.3 C)   Ht 6\' 3"  (1.905 m)   Wt 194 lb 6 oz (88.2 kg)   BMI 24.30 kg/m  Constitutional:  Pleasant, in no acute distress. Psychiatric: Normal mood and affect. Behavior is normal. EENT: Pupils normal.  Conjunctivae are normal. No scleral icterus. Neck supple. No cervical LAD. Cardiovascular: Normal rate, regular rhythm. No edema Pulmonary/chest: Effort normal and breath sounds normal. No wheezing, rales or rhonchi. Abdominal: Soft, nondistended, nontender. Bowel sounds active throughout. There are no masses palpable. No hepatomegaly. Neurological: Alert and oriented to person place and time. Skin: Skin is warm and dry. No rashes noted.   ASSESSMENT AND PLAN;   1) Family History of Pancreatic Cancer: 54 year old male with a strong family history of Pancreatic Cancer.  Reviewed the most recent Francisco Phillips and Francisco Phillips guidelines, with screening for pancreatic cancer recommended in certain high risk individuals.  Francisco Phillips meets screening criteria based on the following:  -Individuals with at least one first-degree relative with pancreatic CA who in turn also has a first-degree relative with pancreatic CA (?2 affected, genetically-related family members; familial pancreatic cancer kindred)  Recommended age to start surveillance varies by gene mutation status and family history, as follows: -Familial pancreatic cancer kindred (without a known germline mutation)-start at age 52, or 67 years younger than the youngest affected blood relative  Screening techniques include the following: -Baseline: MRI/MRCP plus EUS plus fasting blood glucose and/or hemoglobin A1c -Follow-up/surveillance phase: Alternate MRI/MRCP and EUS plus routine fasting blood glucose and/or hemoglobin A1c -If concerning features on imaging, check CA 19-9 -EUS with FNA for solid lesions ?5 mm, cystic lesions with worrisome features,  or asymptomatic main pancreatic duct (MPD) strictures (with or without mass) -CT only for solid lesions, regardless of size, or asymptomatic MPD strictures of unknown etiology (without mass)  -Screening interval every 6 months (with alternating MRI/EUS) in patients with no abnormalities/nonconcerning abnormalities -Screening interval every 3-6 months in patients with abnormalities that are NOT suspicious for malignancy, but are concerning -EUS evaluation should be performed within 3-6 months for indeterminate lesions (abnormalities that are NOT suspicious for malignancy, but are concerning) -EUS evaluation should be performed within 3 months for high-risk lesions, if surgical resection is not planned. -Immediate surgical referral for abnormality suspicious for malignancy  -New-onset diabetes in a high-risk individual should lead to additional diagnostic studies or change in surveillance interval  -Participation in a registry or referral to a pancreas Center of Excellence should be pursued when possible for high-risk patients undergoing pancreas cancer screening -The target detectable pancreatic neoplasms are resectable stage I pancreatic ductal adenocarcinoma and high-risk precursor neoplasms, such as intraductal papillary mucinous neoplasms with high-grade dysplasia and some enlarged pancreatic intraepithelial neoplasias  -  We discussed the limitations and potential risks of pancreas cancer screening prior to initiating any screening program, and the patient wishes to proceed with screening.        -MRI abdomen with pancreas protocol now        -EUS with Dr. Ardis Hughs or Dr. 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  No history of pancreatitis and no clinical features of chronic pancreatitis, but will certainly evaluate for radiographic/endosonographic features of  chronic pancreatitis at time of MRI and EUS.  3) Dysphagia 4) Reflux -Plan for EGD to assess for erosive esophagitis, esophageal stricture, ring, luminal narrowing, etc. -EGD with esophageal dilation as appropriate -Depending on endoscopic findings, can start on acid suppression therapy as appropriate  5) Elevated liver enzymes -Reported elevated liver enzymes.  Labs not available for review at this appointment.  He has an appointment with Dr. Watt Phillips (whom he knows personally) already scheduled for evaluation of the liver enzymes, and wishes to keep that appointment.  Alternatively, happy to perform his liver evaluation and review of those prior labs if he (or Dr. Watt Phillips) prefer to consolidate Phillips/hepatobiliary care.  RTC 6 months  I spent 50 minutes of time, including in depth chart review, independent review of results as outlined above, communicating results with the patient directly, face-to-face time with the patient, coordinating care, ordering studies and medications as appropriate, and documentation.   The indications, risks, and benefits of EGD with dilation were explained to the patient in detail. Risks include but are not limited to bleeding, perforation, adverse reaction to medications, and cardiopulmonary compromise. Sequelae include but are not limited to the possibility of surgery, hositalization, and mortality. The patient verbalized understanding and wished to proceed. All questions answered, referred to scheduler. Further recommendations pending results of the exam.      Lavena Bullion, DO, FACG  09/03/2019, 8:56 AM   Deland Pretty, MD

## 2019-09-03 NOTE — Patient Instructions (Signed)
You have been scheduled for an MRI/MRCP at [facility] on [address]. Your appointment time is 7am. Please arrive 15 minutes prior to your appointment time for registration purposes. Please make certain not to have anything to eat or drink 6 hours prior to your test. In addition, if you have any metal in your body, have a pacemaker or defibrillator, please be sure to let your ordering physician know. This test typically takes 45 minutes to 1 hour to complete. Should you need to reschedule, please call [phone] to do so.   You have been scheduled for an endoscopy. Please follow written instructions given to you at your visit today. If you use inhalers (even only as needed), please bring them with you on the day of your procedure. Your physician has requested that you go to www.startemmi.com and enter the access code given to you at your visit today. This web site gives a general overview about your procedure. However, you should still follow specific instructions given to you by our office regarding your preparation for the procedure.  It was a pleasure to see you today!  Vito Cirigliano, D.O.

## 2019-09-04 ENCOUNTER — Encounter: Payer: Self-pay | Admitting: Gastroenterology

## 2019-09-10 ENCOUNTER — Ambulatory Visit (HOSPITAL_COMMUNITY)
Admission: RE | Admit: 2019-09-10 | Discharge: 2019-09-10 | Disposition: A | Discharge status: Home / Self Care | Payer: 59 | Source: Ambulatory Visit | Attending: Gastroenterology | Admitting: Gastroenterology

## 2019-09-10 ENCOUNTER — Other Ambulatory Visit: Payer: Self-pay

## 2019-09-10 ENCOUNTER — Other Ambulatory Visit: Payer: Self-pay | Admitting: Gastroenterology

## 2019-09-10 DIAGNOSIS — Z800 Family history of malignant neoplasm of digestive organs: Secondary | ICD-10-CM | POA: Diagnosis present

## 2019-09-10 DIAGNOSIS — R1310 Dysphagia, unspecified: Secondary | ICD-10-CM | POA: Insufficient documentation

## 2019-09-10 DIAGNOSIS — K219 Gastro-esophageal reflux disease without esophagitis: Secondary | ICD-10-CM

## 2019-09-10 IMAGING — MR MR ABDOMEN WO/W CM MRCP
14 of 19 series · 35 of 48 positions shown · IV contrast (gadavist)
Comparison: None.

CLINICAL DATA: Evaluate for pancreas cancer. Strong family history
of pancreas neoplasm.

EXAM:
MRI ABDOMEN WITHOUT AND WITH CONTRAST (INCLUDING MRCP)
TECHNIQUE: Multiplanar multisequence MR imaging of the abdomen was performed
both before and after the administration of intravenous contrast.
Heavily T2-weighted images of the biliary and pancreatic ducts were
obtained, and three-dimensional MRCP images were rendered by post
processing.
CONTRAST:  8mL GADAVIST GADOBUTROL 1 MMOL/ML IV SOLN

[Series 5: T2 fat-sat · axial · 6.0mm · 1.25mm/px · z∈[-117,+164]mm · 2 of 40 slices shown]
[im 1/40]
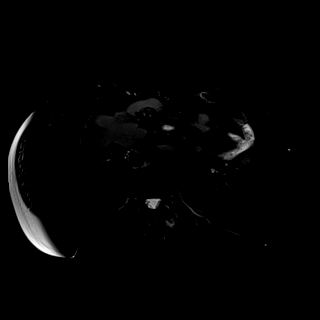
[im 40/40]
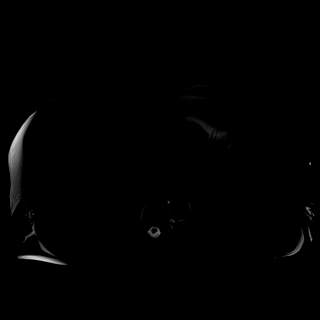

[Series 7: bSSFP · coronal · 6.0mm · 0.74mm/px · 2 of 43 slices shown]
[im 1/43]
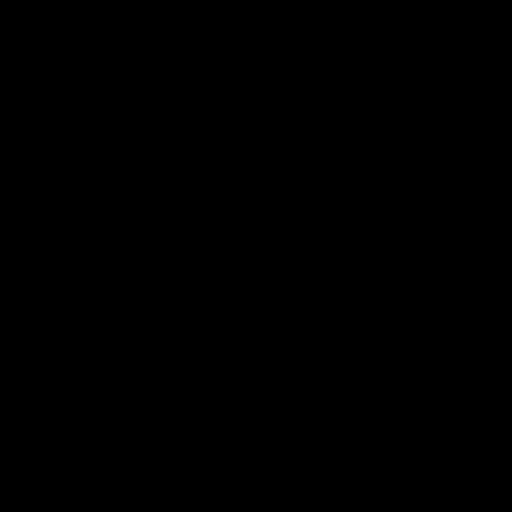
[im 43/43]
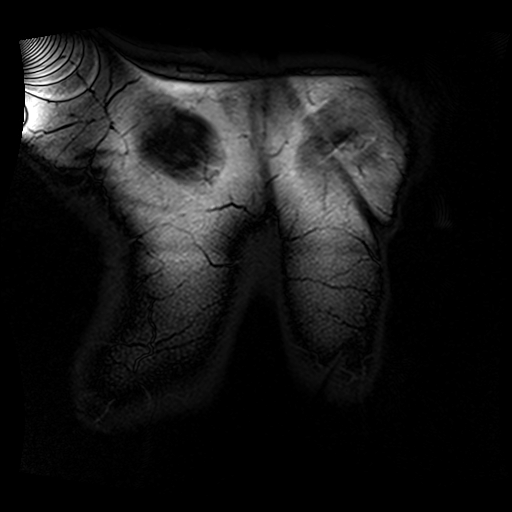

[Series 9: DWI · axial · 6.0mm · 1.49mm/px · 1 of 36 slices shown]
[im 1/36]
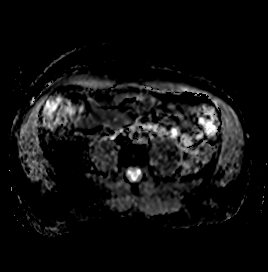

[Series 10: T1 · axial · 3.0mm · 1.25mm/px · z∈[-142,+119]mm · 3 of 88 slices shown (1 of 2)]
[im 1/88]
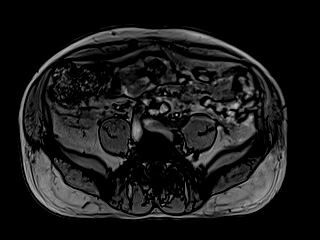
[im 44/88]
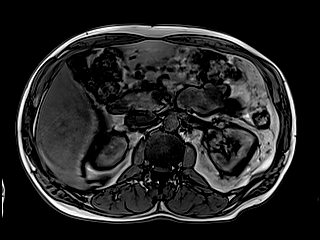
[im 88/88]
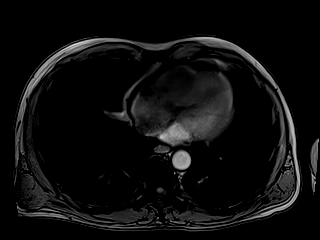

[Series 11: T1 · axial · 3.0mm · 1.25mm/px · z∈[-142,+119]mm · 3 of 88 slices shown (2 of 2)]
[im 1/88]
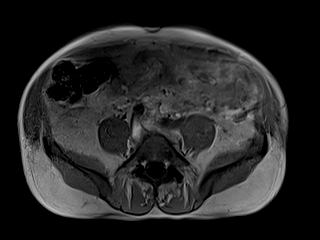
[im 44/88]
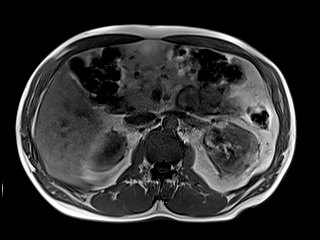
[im 88/88]
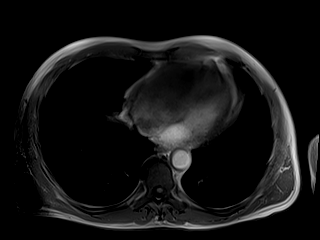

[Series 12: cor obl thk · sagittal · 50.0mm · 0.78mm/px · 1 of 9 slices shown]
[im 1/9]
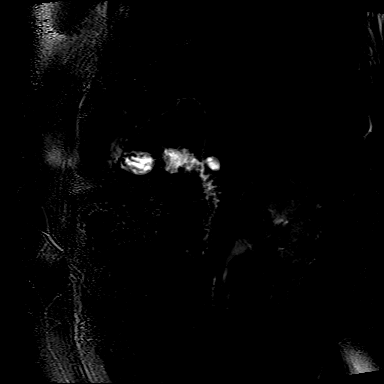

[Series 14: cor_3d_spc_trig · coronal · 1.0mm · 0.49mm/px · 3 of 80 slices shown]
[im 1/80]
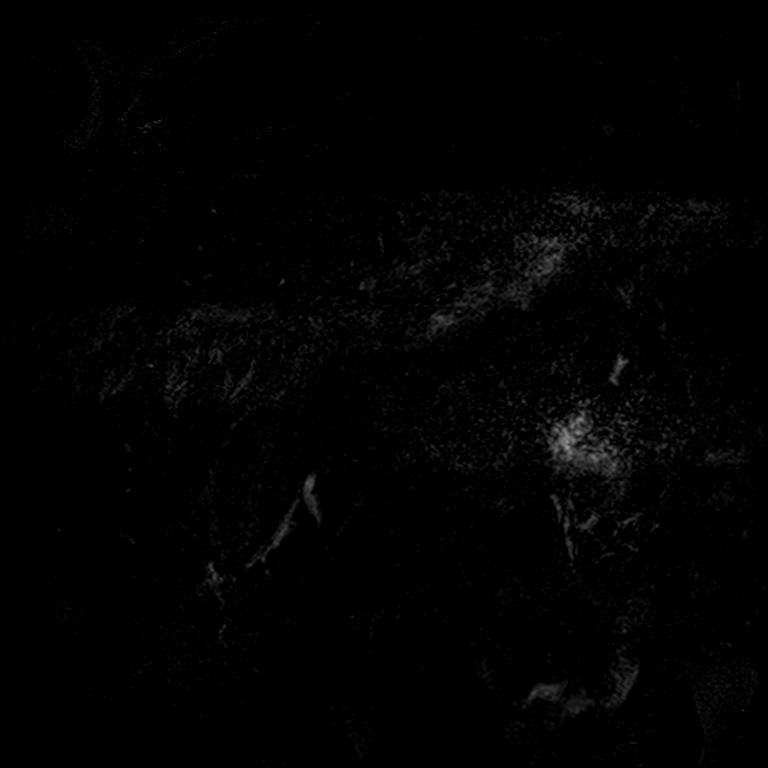
[im 40/80]
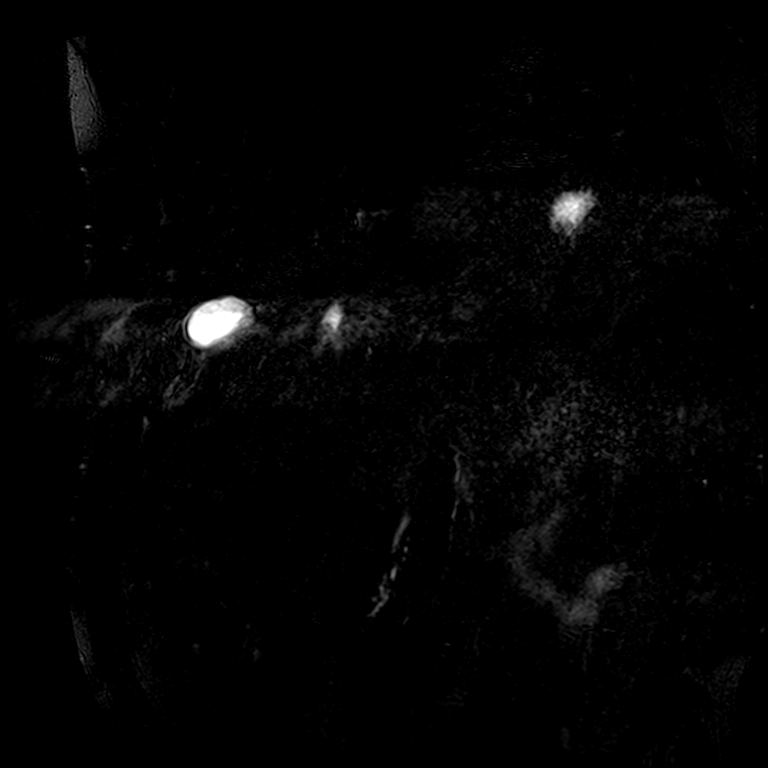
[im 80/80]
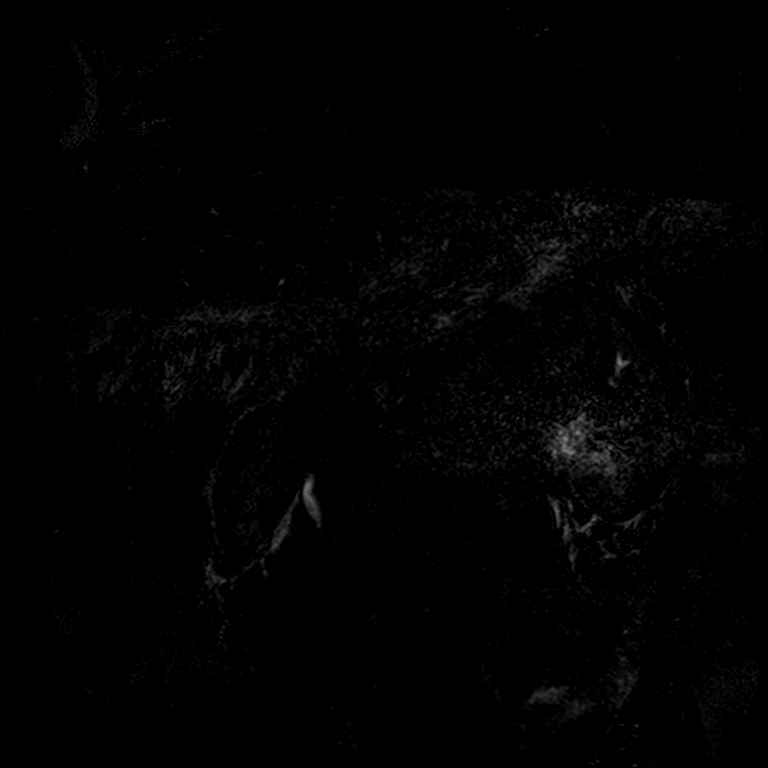

[Series 18: T1 dynamic · axial · 3.0mm · 1.25mm/px · z∈[-145,+140]mm · 3 of 96 slices shown (1 of 6)]
[im 1/96]
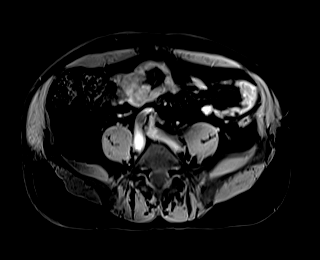
[im 48/96]
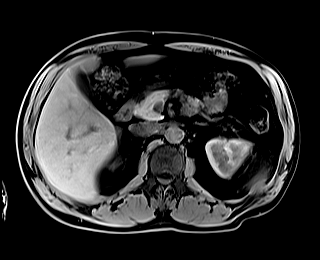
[im 96/96]
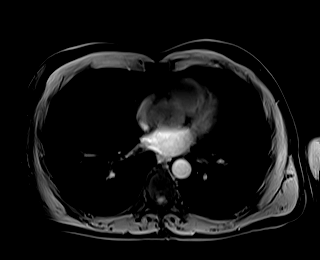

[Series 21: T1 dynamic · axial · 3.0mm · 1.25mm/px · z∈[-145,+140]mm · 3 of 96 slices shown (2 of 6)]
[im 1/96]
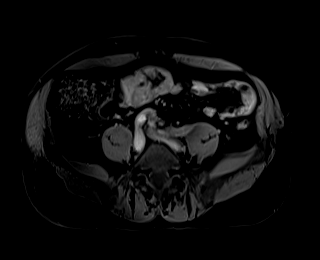
[im 48/96]
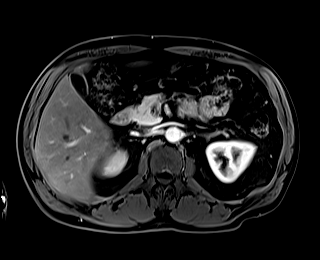
[im 96/96]
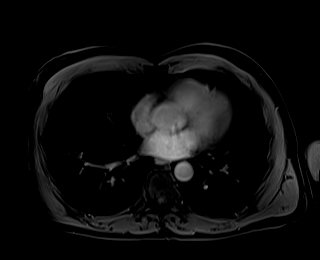

[Series 23: T1 dynamic · axial · 3.0mm · 1.25mm/px · z∈[-145,+140]mm · 3 of 96 slices shown (3 of 6)]
[im 1/96]
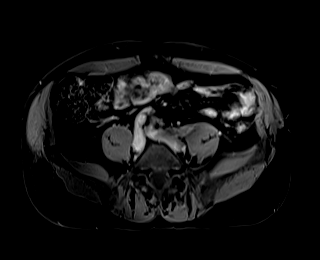
[im 48/96]
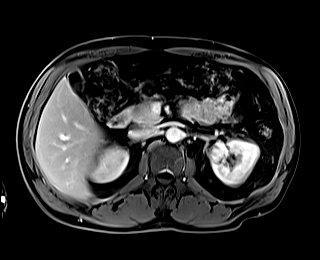
[im 96/96]
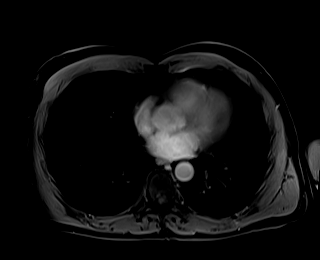

[Series 25: T1 dynamic · axial · 3.0mm · 1.25mm/px · z∈[-145,+140]mm · 3 of 96 slices shown (4 of 6)]
[im 1/96]
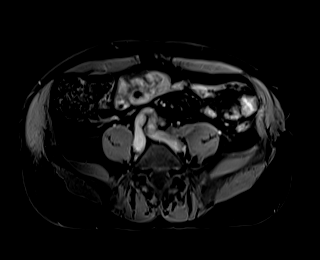
[im 48/96]
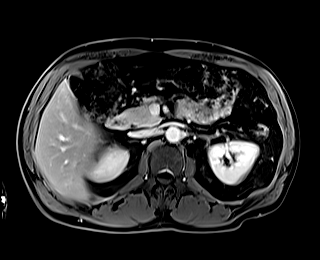
[im 96/96]
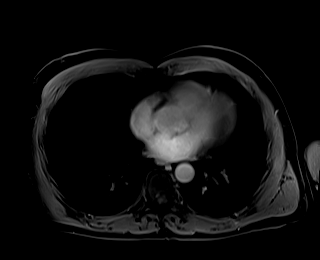

[Series 27: T1 dynamic · coronal · 4.0mm · 1.41mm/px · 2 of 72 slices shown (5 of 6)]
[im 1/72]
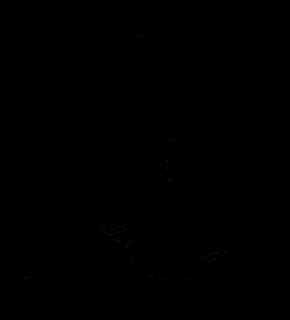
[im 72/72]
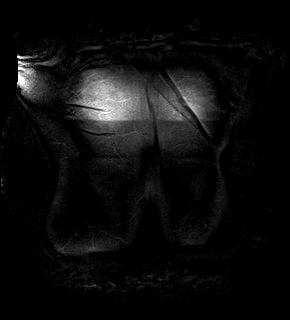

[Series 29: T1 dynamic · axial · 3.0mm · 1.25mm/px · z∈[-145,+140]mm · 3 of 96 slices shown (6 of 6)]
[im 1/96]
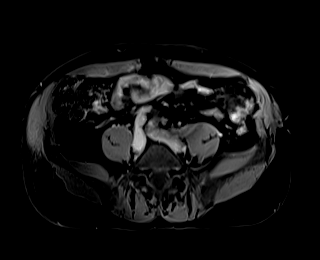
[im 48/96]
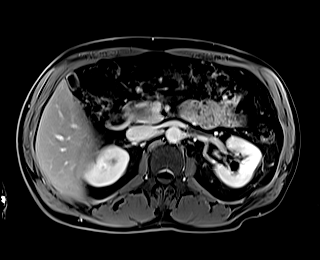
[im 96/96]
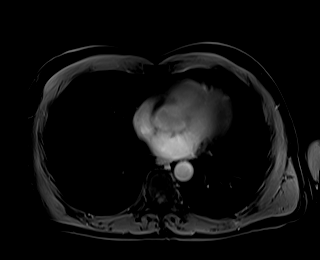

[Series 100: sub_20 sec · axial · 3.0mm · 1.25mm/px · z∈[-145,+140]mm · 3 of 96 slices shown]
[im 1/96]
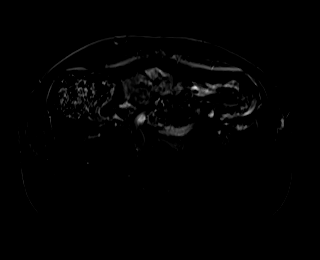
[im 48/96]
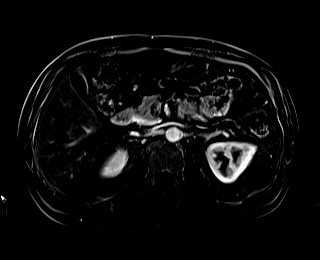
[im 96/96]
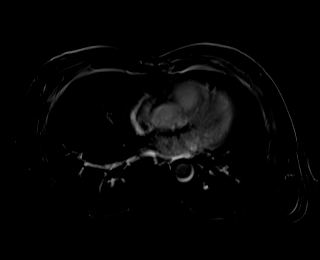

[35 of 48 positions shown; findings below may reference images not displayed]

FINDINGS: Lower chest: No acute findings.

Hepatobiliary: No mass or other parenchymal abnormality identified.
Gallbladder normal. No bile duct dilatation.

Pancreas: No mass, inflammatory changes, or other parenchymal
abnormality identified.

Spleen:  Within normal limits in size and appearance.

Adrenals/Urinary Tract: No masses identified. No evidence of
hydronephrosis.

Stomach/Bowel: Visualized portions within the abdomen are
unremarkable.

Vascular/Lymphatic: No pathologically enlarged lymph nodes
identified. No abdominal aortic aneurysm demonstrated.

Other:  No free fluid or fluid collections.

Musculoskeletal: No suspicious bone lesions identified.
IMPRESSION: Normal MRI of the abdomen. No evidence for pancreatic mass or other
significant abnormality.

## 2019-09-10 MED ORDER — GADOBUTROL 1 MMOL/ML IV SOLN
8.0000 mL | Freq: Once | INTRAVENOUS | Status: AC | PRN
  Administered 2019-09-10: 8 mL via INTRAVENOUS

## 2019-09-12 ENCOUNTER — Telehealth: Payer: Self-pay | Admitting: Gastroenterology

## 2019-09-12 NOTE — Telephone Encounter (Signed)
Called and spoke with patient- patient reports he is having a work emergency and he will not be able to make his COVID screening appt today-patient has been rescheduled for 09/13/2019 at 10:10 am at the [facility] patient testing area; Patient advised to call back to the office at [phone] should questions/concerns arise;  Patient verbalized understanding of information/instructions and patient was sent a MyChart message with instructions on appt location;

## 2019-09-13 ENCOUNTER — Other Ambulatory Visit (HOSPITAL_COMMUNITY)
Admission: RE | Admit: 2019-09-13 | Discharge: 2019-09-13 | Disposition: A | Discharge status: Home / Self Care | Payer: 59 | Source: Ambulatory Visit | Attending: Gastroenterology | Admitting: Gastroenterology

## 2019-09-13 DIAGNOSIS — Z01812 Encounter for preprocedural laboratory examination: Secondary | ICD-10-CM | POA: Insufficient documentation

## 2019-09-13 DIAGNOSIS — Z20822 Contact with and (suspected) exposure to covid-19: Secondary | ICD-10-CM | POA: Insufficient documentation

## 2019-09-13 LAB — SARS CORONAVIRUS 2 (TAT 6-24 HRS): SARS Coronavirus 2: NEGATIVE

## 2019-09-15 ENCOUNTER — Telehealth: Payer: Self-pay

## 2019-09-15 NOTE — Telephone Encounter (Signed)
Recall EUS in Epic as requested

## 2019-09-15 NOTE — Telephone Encounter (Signed)
-----   Message from Irving Copas., MD sent at 09/14/2019 11:08 AM EDT ----- VC, I think it is reasonable since the MRI was normal to do his EUS in 32-months and then begin alternating. For these patients, DJ or I can do the EUS and then work on scheduling a 48-month MRICP.   Once the MRICP has been completed, the patient comes in a few weeks later to be seen and go over results and let them know if anything new has occurred on the [place] and then schedule the next EUS for 62-months. It helps keep the patients within the GI group. Lindbergh Winkles, please move forward with putting this patient in the cue for a 66-month EUS with DJ or myself. Thanks. GM ----- Message ----- From: Lavena Bullion, DO Sent: 09/12/2019   3:51 PM EDT To: Milus Banister, MD, #  MRI/MRCP was normal.  As long as you both are okay with this plan, I will plan on seeing him in the office for 49-month follow-up, and will coordinate EUS at that time. ----- Message ----- From: Irving Copas., MD Sent: 09/08/2019   7:28 AM EDT To: Milus Banister, MD, Clarene Essex, Counselor, #  VC, Thanks for putting him on the list. Let DJ and I know about the results of the [place] and then we can go from there in regards to the scheduling/timing of an EUS. Thanks. GM ----- Message ----- From: Lavena Bullion, DO Sent: 09/03/2019   3:13 PM EDT To: Milus Banister, MD, Clarene Essex, Counselor, #

## 2019-09-16 ENCOUNTER — Encounter: Payer: Self-pay | Admitting: Gastroenterology

## 2019-09-16 ENCOUNTER — Other Ambulatory Visit: Payer: Self-pay

## 2019-09-16 ENCOUNTER — Ambulatory Visit (AMBULATORY_SURGERY_CENTER): Payer: 59 | Admitting: Gastroenterology

## 2019-09-16 VITALS — BP 120/76 | HR 54 | Temp 95.1°F | Resp 14 | Ht 75.0 in | Wt 194.0 lb

## 2019-09-16 DIAGNOSIS — K2950 Unspecified chronic gastritis without bleeding: Secondary | ICD-10-CM | POA: Diagnosis not present

## 2019-09-16 DIAGNOSIS — R1310 Dysphagia, unspecified: Secondary | ICD-10-CM

## 2019-09-16 DIAGNOSIS — K2100 Gastro-esophageal reflux disease with esophagitis, without bleeding: Secondary | ICD-10-CM | POA: Diagnosis not present

## 2019-09-16 DIAGNOSIS — K317 Polyp of stomach and duodenum: Secondary | ICD-10-CM | POA: Diagnosis not present

## 2019-09-16 DIAGNOSIS — K449 Diaphragmatic hernia without obstruction or gangrene: Secondary | ICD-10-CM

## 2019-09-16 DIAGNOSIS — K209 Esophagitis, unspecified without bleeding: Secondary | ICD-10-CM

## 2019-09-16 DIAGNOSIS — K2970 Gastritis, unspecified, without bleeding: Secondary | ICD-10-CM | POA: Diagnosis not present

## 2019-09-16 DIAGNOSIS — K222 Esophageal obstruction: Secondary | ICD-10-CM

## 2019-09-16 MED ORDER — PANTOPRAZOLE SODIUM 40 MG PO TBEC: 40.0000 mg | DELAYED_RELEASE_TABLET | Freq: Two times a day (BID) | ORAL | 3 refills | Status: DC

## 2019-09-16 MED ORDER — SODIUM CHLORIDE 0.9 % IV SOLN: 500.0000 mL | Freq: Once | INTRAVENOUS | Status: DC

## 2019-09-16 NOTE — Op Note (Signed)
[place] Patient Name: Francisco Phillips Procedure Date: 09/16/2019 10:34 AM MRN: [id number] Endoscopist: Gerrit Heck , MD Age: 54 Referring MD:  Date of Birth: 09-06-1965 Gender: Male Account #: [id number] Procedure:                Upper GI endoscopy Indications:              Dysphagia, Heartburn, Suspected esophageal reflux Medicines:                Monitored Anesthesia Care Procedure:                Pre-Anesthesia Assessment:                           - Prior to the procedure, a History and Physical                            was performed, and patient medications and                            allergies were reviewed. The patient's tolerance of                            previous anesthesia was also reviewed. The risks                            and benefits of the procedure and the sedation                            options and risks were discussed with the patient.                            All questions were answered, and informed consent                            was obtained. Prior Anticoagulants: The patient has                            taken no previous anticoagulant or antiplatelet                            agents. ASA Grade Assessment: II - A patient with                            mild systemic disease. After reviewing the risks                            and benefits, the patient was deemed in                            satisfactory condition to undergo the procedure.                           After obtaining informed consent, the endoscope was  passed under direct vision. Throughout the                            procedure, the patient's blood pressure, pulse, and                            oxygen saturations were monitored continuously. The                            Endoscope was introduced through the mouth, and                            advanced to the second part of duodenum. The upper                            GI  endoscopy was accomplished without difficulty.                            The patient tolerated the procedure well. Scope In: Scope Out: 10:54:06 AM Findings:                 Esophagogastric landmarks were identified: the                            gastroesophageal junction was found at 39 cm and                            the site of hiatal narrowing was found at 43 cm                            from the incisors.                           A 4 cm hiatal hernia was present.                           One benign-appearing, intrinsic moderate stenosis                            was found 39 cm from the incisors. This stenosis                            measured less than one cm (in length). The stenosis                            was traversed. A TTS dilator was passed through the                            scope. Dilation with a 13.5-14.5-15.5 mm balloon                            dilator was performed to 15.5 mm. The dilation site  was examined and showed mild mucosal disruption.                            Estimated blood loss was minimal. This was then                            biopsied with a cold forceps for further fracturing                            of the ring and sent for histology. Estimated blood                            loss was minimal.                           One tongue of salmon-colored mucosa was present                            from 38 to 39 cm. No other visible abnormalities                            were present. Biopsies were taken with a cold                            forceps for histology. Estimated blood loss was                            minimal.                           LA Grade B (one or more mucosal breaks greater than                            5 mm, not extending between the tops of two mucosal                            folds) esophagitis with no bleeding was found in                            the lower third of the  esophagus.                           The upper third of the esophagus and middle third                            of the esophagus were normal.                           The gastroesophageal flap valve was visualized                            endoscopically and classified as Hill Grade IV (no  fold, wide open lumen, hiatal hernia present).                           Localized mild inflammation characterized by                            erosions and erythema was found in the gastric                            antrum. Biopsies were taken with a cold forceps for                            histology. Estimated blood loss was minimal.                           The gastric fundus and gastric body were normal.                           The duodenal bulb, first portion of the duodenum                            and second portion of the duodenum were normal. Complications:            No immediate complications. Estimated Blood Loss:     Estimated blood loss was minimal. Impression:               - Esophagogastric landmarks identified.                           - 4 cm hiatal hernia.                           - Benign-appearing esophageal stenosis. Dilated                            with a 15.5 mm TTS balloon with appropriate mucosal                            rent formation, then fractured with cold forceps.                           - Salmon-colored mucosa. Biopsied.                           - LA Grade B reflux esophagitis with no bleeding.                           - Normal upper third of esophagus and middle third                            of esophagus.                           - Gastroesophageal flap valve classified as Hill  Grade IV (no fold, wide open lumen, hiatal hernia                            present).                           - Gastritis. Biopsied.                           - Normal gastric fundus and gastric body.                            - Normal duodenal bulb, first portion of the                            duodenum and second portion of the duodenum. Recommendation:           - Patient has a contact number available for                            emergencies. The signs and symptoms of potential                            delayed complications were discussed with the                            patient. Return to normal activities tomorrow.                            Written discharge instructions were provided to the                            patient.                           - Soft diet today (post dilation diet) then advance                            as tolerated tomorrow.                           - Continue present medications.                           - Await pathology results.                           - Repeat upper endoscopy in 8 weeks to check                            healing and repeat dilation as indicated.                           - Use Protonix (pantoprazole) 40 mg PO BID for 8  weeks to promote mucosal healing, then reduce to 40                            mg/day for ongoing control of reflux symptoms. Gerrit Heck, MD 09/16/2019 11:05:39 AM

## 2019-09-16 NOTE — Progress Notes (Signed)
Temp LC V/S CW 

## 2019-09-16 NOTE — Progress Notes (Signed)
Report given to PACU, vss 

## 2019-09-16 NOTE — Patient Instructions (Signed)
YOU HAD AN ENDOSCOPIC PROCEDURE TODAY AT [facility]:   Refer to the procedure report that was given to you for any specific questions about what was found during the examination.  If the procedure report does not answer your questions, please call your gastroenterologist to clarify.  If you requested that your care partner not be given the details of your procedure findings, then the procedure report has been included in a sealed envelope for you to review at your convenience later.  YOU SHOULD EXPECT: Some feelings of bloating in the abdomen. Passage of more gas than usual.  Walking can help get rid of the air that was put into your GI tract during the procedure and reduce the bloating.   Please Note:  You might notice some irritation and congestion in your nose or some drainage.  This is from the oxygen used during your procedure.  There is no need for concern and it should clear up in a day or so.  SYMPTOMS TO REPORT IMMEDIATELY:    Following upper endoscopy (EGD)  Vomiting of blood or coffee ground material  New chest pain or pain under the shoulder blades  Painful or persistently difficult swallowing  New shortness of breath  Fever of 100F or higher  Black, tarry-looking stools  For urgent or emergent issues, a gastroenterologist can be reached at any hour by calling [phone]. Do not use MyChart messaging for urgent concerns.    DIET:  We do recommend  Clear liquids until 1145 am.  Then you may have a soft diet for the rest of today.You may proceed to your regular diet tomorrow..  Drink plenty of fluids but you should avoid alcoholic beverages for 24 hours.  ACTIVITY:  You should plan to take it easy for the rest of today and you should NOT DRIVE or use heavy machinery until tomorrow (because of the sedation medicines used during the test).    FOLLOW UP: Our staff will call the number listed on your records 48-72 hours following your procedure to check on you  and address any questions or concerns that you may have regarding the information given to you following your procedure. If we do not reach you, we will leave a message.  We will attempt to reach you two times.  During this call, we will ask if you have developed any symptoms of COVID 19. If you develop any symptoms (ie: fever, flu-like symptoms, shortness of breath, cough etc.) before then, please call [phone].  If you test positive for Covid 19 in the 2 weeks post procedure, please call and report this information to [place].    If any biopsies were taken you will be contacted by phone or by letter within the next 1-3 weeks.  Please call us at [phone] if you have not heard about the biopsies in 3 weeks.    SIGNATURES/CONFIDENTIALITY: You and/or your care partner have signed paperwork which will be entered into your electronic medical record.  These signatures attest to the fact that that the information above on your After Visit Summary has been reviewed and is understood.  Full responsibility of the confidentiality of this discharge information lies with you and/or your care-partner.

## 2019-09-17 ENCOUNTER — Telehealth: Payer: Self-pay

## 2019-09-17 NOTE — Telephone Encounter (Signed)
Spoke with a scheduler at [facility] to inform them that patient has decided to cancel his appointment with Dr Wilfrid Lund will consolidate all GI care to Dr Bryan Lemma. A copy of his recent labs from Dr Shelia Media received a placed on MD's desk for review.

## 2019-09-17 NOTE — Telephone Encounter (Signed)
-----   Message from [place], DO sent at 09/16/2019 11:06 AM EDT ----- Patient would like to cancel his appt with Dr. Watt Climes at [facility] for eval of elevated liver enzymes and consolidate all GI care to [place]. Can you call over to let them know at [facility]? Also, can you get a  copy of his recent labs from Dr. Shelia Media (his [facility]) for me to review. Thanks!

## 2019-09-18 ENCOUNTER — Telehealth: Payer: Self-pay

## 2019-09-18 NOTE — Telephone Encounter (Signed)
  Follow up Call-  Call back number 09/16/2019  Post procedure Call Back phone  # [phone]  Permission to leave phone message Yes  Some recent data might be hidden     Patient questions:  Do you have a fever, pain , or abdominal swelling? No. Pain Score  0 *  Have you tolerated food without any problems? Yes.    Have you been able to return to your normal activities? Yes.    Do you have any questions about your discharge instructions: Diet   No. Medications  No. Follow up visit  No.  Do you have questions or concerns about your Care? No.  Actions: * If pain score is 4 or above: No action needed, pain <4. 1. Have you developed a fever since your procedure? no  2.   Have you had an respiratory symptoms (SOB or cough) since your procedure? no  3.   Have you tested positive for COVID 19 since your procedure no  4.   Have you had any family members/close contacts diagnosed with the COVID 19 since your procedure?  no   If yes to any of these questions please route to Joylene John, RN and Erenest Rasher, RN

## 2019-09-26 ENCOUNTER — Telehealth: Payer: Self-pay | Admitting: Gastroenterology

## 2019-09-26 NOTE — Telephone Encounter (Signed)
Lab results received from patient's PCM, Dr. Shelia Media, and notable for the following:  -06/10/2019: AST/ALT 43/57, normal protein, albumin, ALP, T bili.  Normal CBC -07/21/2019: AST/ALT 79/132, normal protein, albumin, ALP, T bili -08/18/2019: AST/ALT 31/90, normal protein, albumin, ALP, T bili -09/04/2019: Negative HIV, RPR  Mildly elevated liver enzymes initially in 05/2019, with uptrend in 07/2019, then declined again 08/2019.  Otherwise preserved hepatic synthetic function based on albumin, PLT, renal function.  Suspect some degree of fatty liver infiltration.  Can plan to address in further detail at follow-up appointment.  Recent imaging was otherwise normal-appearing liver.

## 2019-09-30 ENCOUNTER — Encounter: Payer: Self-pay | Admitting: Gastroenterology

## 2019-10-14 ENCOUNTER — Other Ambulatory Visit: Payer: Self-pay

## 2019-10-14 ENCOUNTER — Ambulatory Visit (AMBULATORY_SURGERY_CENTER): Payer: Self-pay

## 2019-10-14 VITALS — Temp 98.0°F | Ht 75.0 in | Wt 197.0 lb

## 2019-10-14 DIAGNOSIS — K2100 Gastro-esophageal reflux disease with esophagitis, without bleeding: Secondary | ICD-10-CM

## 2019-10-14 DIAGNOSIS — R1310 Dysphagia, unspecified: Secondary | ICD-10-CM

## 2019-10-14 NOTE — Progress Notes (Signed)
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  Denies atrial flutter/fib Denies constipation   Emmi instructions given to pt  Pt is aware of Covid safety and care partner requirements.

## 2019-10-21 ENCOUNTER — Telehealth: Payer: Self-pay | Admitting: Gastroenterology

## 2019-10-21 NOTE — Telephone Encounter (Signed)
Dr. Bryan Lemma, this pt rescheduled his 5/10 EGD to 6/22 due to lack of transportation.

## 2019-10-21 NOTE — Telephone Encounter (Signed)
No problem... Thank you!

## 2019-10-27 ENCOUNTER — Encounter: Payer: 59 | Admitting: Gastroenterology

## 2019-12-09 ENCOUNTER — Ambulatory Visit (AMBULATORY_SURGERY_CENTER): Payer: 59 | Admitting: Gastroenterology

## 2019-12-09 ENCOUNTER — Encounter: Payer: Self-pay | Admitting: Gastroenterology

## 2019-12-09 ENCOUNTER — Other Ambulatory Visit: Payer: Self-pay

## 2019-12-09 VITALS — BP 131/83 | HR 64 | Temp 98.0°F | Resp 13 | Ht 75.0 in | Wt 197.0 lb

## 2019-12-09 DIAGNOSIS — K449 Diaphragmatic hernia without obstruction or gangrene: Secondary | ICD-10-CM | POA: Diagnosis not present

## 2019-12-09 DIAGNOSIS — R1310 Dysphagia, unspecified: Secondary | ICD-10-CM | POA: Diagnosis present

## 2019-12-09 DIAGNOSIS — K200 Eosinophilic esophagitis: Secondary | ICD-10-CM

## 2019-12-09 DIAGNOSIS — K2980 Duodenitis without bleeding: Secondary | ICD-10-CM

## 2019-12-09 DIAGNOSIS — K2100 Gastro-esophageal reflux disease with esophagitis, without bleeding: Secondary | ICD-10-CM | POA: Diagnosis not present

## 2019-12-09 DIAGNOSIS — K222 Esophageal obstruction: Secondary | ICD-10-CM | POA: Diagnosis not present

## 2019-12-09 MED ORDER — SODIUM CHLORIDE 0.9 % IV SOLN: 500.0000 mL | Freq: Once | INTRAVENOUS | Status: DC

## 2019-12-09 NOTE — Progress Notes (Signed)
Pt Drowsy. VSS. To PACU, report to RN. No anesthetic complications noted.  

## 2019-12-09 NOTE — Patient Instructions (Addendum)
Impression/Recommendations:  Dilation diet handout given to patient.  Soft diet today. Hiatal hernia handout given to patient.  Continue present medications. Await pathology results.  Repeat upper endoscopy as needed.  Return to GI clinic in 3-6 months, or sooner as needed.  YOU HAD AN ENDOSCOPIC PROCEDURE TODAY AT [facility]:   Refer to the procedure report that was given to you for any specific questions about what was found during the examination.  If the procedure report does not answer your questions, please call your gastroenterologist to clarify.  If you requested that your care partner not be given the details of your procedure findings, then the procedure report has been included in a sealed envelope for you to review at your convenience later.  YOU SHOULD EXPECT: Some feelings of bloating in the abdomen. Passage of more gas than usual.  Walking can help get rid of the air that was put into your GI tract during the procedure and reduce the bloating. If you had a lower endoscopy (such as a colonoscopy or flexible sigmoidoscopy) you may notice spotting of blood in your stool or on the toilet paper. If you underwent a bowel prep for your procedure, you may not have a normal bowel movement for a few days.  Please Note:  You might notice some irritation and congestion in your nose or some drainage.  This is from the oxygen used during your procedure.  There is no need for concern and it should clear up in a day or so.  SYMPTOMS TO REPORT IMMEDIATELY:  Following upper endoscopy (EGD)  Vomiting of blood or coffee ground material  New chest pain or pain under the shoulder blades  Painful or persistently difficult swallowing  New shortness of breath  Fever of 100F or higher  Black, tarry-looking stools  For urgent or emergent issues, a gastroenterologist can be reached at any hour by calling [phone]. Do not use MyChart messaging for urgent concerns.    DIET:   We do recommend a small meal at first, but then you may proceed to your regular diet.  Drink plenty of fluids but you should avoid alcoholic beverages for 24 hours.  ACTIVITY:  You should plan to take it easy for the rest of today and you should NOT DRIVE or use heavy machinery until tomorrow (because of the sedation medicines used during the test).    FOLLOW UP: Our staff will call the number listed on your records 48-72 hours following your procedure to check on you and address any questions or concerns that you may have regarding the information given to you following your procedure. If we do not reach you, we will leave a message.  We will attempt to reach you two times.  During this call, we will ask if you have developed any symptoms of COVID 19. If you develop any symptoms (ie: fever, flu-like symptoms, shortness of breath, cough etc.) before then, please call [phone].  If you test positive for Covid 19 in the 2 weeks post procedure, please call and report this information to [place].    If any biopsies were taken you will be contacted by phone or by letter within the next 1-3 weeks.  Please call us at [phone] if you have not heard about the biopsies in 3 weeks.    SIGNATURES/CONFIDENTIALITY: You and/or your care partner have signed paperwork which will be entered into your electronic medical record.  These signatures attest to the fact that that the  information above on your After Visit Summary has been reviewed and is understood.  Full responsibility of the confidentiality of this discharge information lies with you and/or your care-partner.

## 2019-12-09 NOTE — Progress Notes (Signed)
Lidocaine 100mg IV given to reduce gag reflex 

## 2019-12-09 NOTE — Progress Notes (Signed)
Called to room to assist during endoscopic procedure.  Patient ID and intended procedure confirmed with present staff. Received instructions for my participation in the procedure from the performing physician.  

## 2019-12-09 NOTE — Progress Notes (Signed)
VS-CW  Pt's states no medical or surgical changes since previsit or office visit.  

## 2019-12-09 NOTE — Op Note (Signed)
[facility] Patient Name: Friedrich Harriott Procedure Date: 12/09/2019 10:19 AM MRN: [id number] Endoscopist: Gerrit Heck , MD Age: 54 Referring MD:  Date of Birth: [birth year] Gender: Male Account #: [id number] Procedure:                Upper GI endoscopy Indications:              Follow-up of reflux esophagitis, Follow-up of                            esophageal stenosis, For therapy of esophageal                            stenosis                           EGD on 09/16/19 with LA Grade B esophagitis, 4 cm                            HH, and stenosis at 39 cm, dilated with 15.5 mm TTS                            balloon. He has been treated with Protonix 40 mg PO                            BID with good clinical response, and presents today                            to evaluate for response to therapy and                            intervention. Medicines:                Monitored Anesthesia Care Procedure:                Pre-Anesthesia Assessment:                           - Prior to the procedure, a History and Physical                            was performed, and patient medications and                            allergies were reviewed. The patient's tolerance of                            previous anesthesia was also reviewed. The risks                            and benefits of the procedure and the sedation                            options and risks were discussed with the patient.  All questions were answered, and informed consent                            was obtained. Prior Anticoagulants: The patient has                            taken no previous anticoagulant or antiplatelet                            agents. ASA Grade Assessment: II - A patient with                            mild systemic disease. After reviewing the risks                            and benefits, the patient was deemed in                            satisfactory  condition to undergo the procedure.                           After obtaining informed consent, the endoscope was                            passed under direct vision. Throughout the                            procedure, the patient's blood pressure, pulse, and                            oxygen saturations were monitored continuously. The                            Endoscope was introduced through the mouth, and                            advanced to the second part of duodenum. The upper                            GI endoscopy was accomplished without difficulty.                            The patient tolerated the procedure well. Scope In: Scope Out: Findings:                 One benign-appearing, intrinsic mild stenosis was                            found 39 cm from the incisors. This stenosis                            measured 1 cm (in length). The stenosis was  traversed. A TTS dilator was passed through the                            scope. Dilation with an 18-19-20 mm balloon dilator                            was performed to 19 mm. The dilation site was                            examined and showed mild mucosal disruption x2,                            consistent with successful dilation. Estimated                            blood loss was minimal.                           Subtle mucosal changes including feline appearance                            and longitudinal furrows were found in the middle                            third of the esophagus and in the lower third of                            the esophagus. Biopsies were obtained from the                            proximal and distal esophagus with cold forceps for                            histology of suspected eosinophilic esophagitis.                            Estimated blood loss was minimal.                           A 4 cm hiatal hernia was present.                           The  entire examined stomach was normal.                           Localized mildly erythematous mucosa without active                            bleeding was found in the duodenal bulb. Biopsies                            were taken with a cold forceps for histology.  Estimated blood loss was minimal.                           The second portion of the duodenum was normal. Complications:            No immediate complications. Estimated Blood Loss:     Estimated blood loss was minimal. Impression:               - Benign-appearing esophageal stenosis. Dilated                            with 19 mm TTS balloon with appropriate mucosal                            rent formation.                           - Esophageal mucosal changes. Biopsied.                           - 4 cm hiatal hernia.                           - Normal stomach.                           - Erythematous duodenopathy. Biopsied.                           - Normal second portion of the duodenum. Recommendation:           - Patient has a contact number available for                            emergencies. The signs and symptoms of potential                            delayed complications were discussed with the                            patient. Return to normal activities tomorrow.                            Written discharge instructions were provided to the                            patient.                           - Soft diet today.                           - Continue present medications.                           - Await pathology results.                           -  Repeat upper endoscopy PRN.                           - Return to GI clinic in 3-6 months, or sooner as                            needed. Gerrit Heck, MD 12/09/2019 10:54:30 AM

## 2019-12-11 ENCOUNTER — Telehealth: Payer: Self-pay | Admitting: *Deleted

## 2019-12-11 NOTE — Telephone Encounter (Signed)
  Follow up Call-  Call back number 12/09/2019 09/16/2019  Post procedure Call Back phone  # [phone]  Permission to leave phone message Yes Yes  Some recent data might be hidden     Patient questions:  Do you have a fever, pain , or abdominal swelling? No. Pain Score  0 *  Have you tolerated food without any problems? Yes.    Have you been able to return to your normal activities? Yes.    Do you have any questions about your discharge instructions: Diet   No. Medications  No. Follow up visit  No.  Do you have questions or concerns about your Care? No.  Actions: * If pain score is 4 or above: No action needed, pain <4  1. Have you developed a fever since your procedure? NO  2.   Have you had an respiratory symptoms (SOB or cough) since your procedure? NO  3.   Have you tested positive for COVID 19 since your procedure NO  4.   Have you had any family members/close contacts diagnosed with the COVID 19 since your procedure?  NO   If yes to any of these questions please route to Joylene John, RN and Erenest Rasher, RN

## 2019-12-23 ENCOUNTER — Encounter: Payer: Self-pay | Admitting: Gastroenterology

## 2020-01-20 ENCOUNTER — Encounter: Payer: Self-pay | Admitting: Gastroenterology

## 2020-01-20 ENCOUNTER — Telehealth: Payer: Self-pay

## 2020-01-20 ENCOUNTER — Ambulatory Visit (INDEPENDENT_AMBULATORY_CARE_PROVIDER_SITE_OTHER): Payer: 59 | Admitting: Gastroenterology

## 2020-01-20 VITALS — BP 140/80 | HR 67 | Ht 74.0 in | Wt 196.1 lb

## 2020-01-20 DIAGNOSIS — Z129 Encounter for screening for malignant neoplasm, site unspecified: Secondary | ICD-10-CM

## 2020-01-20 DIAGNOSIS — K222 Esophageal obstruction: Secondary | ICD-10-CM | POA: Diagnosis not present

## 2020-01-20 DIAGNOSIS — Z800 Family history of malignant neoplasm of digestive organs: Secondary | ICD-10-CM | POA: Diagnosis not present

## 2020-01-20 DIAGNOSIS — K2100 Gastro-esophageal reflux disease with esophagitis, without bleeding: Secondary | ICD-10-CM

## 2020-01-20 DIAGNOSIS — R7989 Other specified abnormal findings of blood chemistry: Secondary | ICD-10-CM

## 2020-01-20 DIAGNOSIS — K449 Diaphragmatic hernia without obstruction or gangrene: Secondary | ICD-10-CM

## 2020-01-20 NOTE — Patient Instructions (Signed)
You will be contacted about scheduling an EUS

## 2020-01-20 NOTE — Telephone Encounter (Signed)
Luanna Salk, thanks  [place], He needs upper EUS with either myself or GM sometime this September for high risk pancreatic cancer screening.  Thanks  [place], [place]

## 2020-01-20 NOTE — Progress Notes (Signed)
P  Chief Complaint:    Elevated liver enzymes  GI History: 54 year old male initially seen in 08/2019 as a referral for Pancreatic Cancer screening due to strong family history of PC.  Family history notable for the following: -Mother: Pancreatic cancer, age 14. Deceased -Father: Breast cancer, age 19 -Maternal grandmother: Pancreatic cancer, age 37 -Maternal uncle: Pancreatic cancer, age 48 -His 3 sons, his brother, and his sister are all cancer free. -Sister's daughter (his niece) with cystic fibrosis. 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  An additional variant of uncertain significance (VUS) identified in the [facility] 1 gene called c.535A>G.  Pancreatic Cancer screening: -MRI/MRCP (08/2019): Normal -EUS: Due in 02/2020.  Referral placed today  Separately, history of solid food dysphagia along with heartburn/regurgitation.  Diagnosed with GERD with erosive esophagitis and distal esophageal stricture on EGD in 08/2019.  Treated with TTS balloon dilation and high-dose PPI with good clinical response.  Repeat EGD in 11/2019 with mucosal improvement in further dilated with 19 mm TTS balloon with resolution of symptoms.  Separately, recently noted elevated liver enzymes (AST/ALT).  Normal albumin, T bili, ALP.  Normal BMP and CBC.  Attributed this to increased EtOH consumption during COVID-19 pandemic. -06/11/2019: AST/ALT 43/57 -07/21/2019: AST/ALT 79/132 -08/18/2019: AST/ALT 51/90   Endoscopic History: -EGD many years ago by Dr. Wynetta Emery at [facility] with reported dilation -Colonoscopy many years ago by Dr. Wynetta Emery as well and reportedly negative/normal -Cologuard (2021): Negative -EGD (09/16/2019, Dr. Bryan Lemma): 4 cm HH, distal esophageal stenosis dilated with 15.5 mm TTS balloon, LA Grade B esophagitis, Hill grade 4 valve, mild gastritis.  Treated with high-dose PPI -EGD (12/09/2019, Dr. Bryan Lemma): 4 cm HH, distal esophageal  stricture dilated with 19 mm TTS balloon.  Esophageal biopsies c/w GERD and negative for EoE.  Gastritis resolved.   HPI:     Patient is a 54 y.o. male presenting to the Gastroenterology Clinic for follow-up and to review recent elevated liver enzymes as outlined above.  Started taking supplements due elevated enzymes: Liver extract w/ Milk Thistle 1500 mg Omega 3 Tumeric Probiotic  Repeat liver enzymes on 08/18/2019 were downtrending as outlined above.  No repeat since then.  Otherwise, maintains active lifestyle with regular exercise, low carbohydrate diet.  Does drink ~5-7 bottles of wine/week.  Review of systems:     No chest pain, no SOB, no fevers, no urinary sx   Past Medical History:  Diagnosis Date  . Allergy   . Cancer ([place])    basal cell carcinoma  . Family history of breast cancer in male   . Family history of cystic fibrosis   . Family history of pancreatic cancer   . GERD (gastroesophageal reflux disease)   . Hyperlipidemia     Patient's surgical history, family medical history, social history, medications and allergies were all reviewed in Epic    Current Outpatient Medications  Medication Sig Dispense Refill  . EPINEPHrine (EPIPEN 2-PAK IJ) Inject 1 each as directed as needed. For reaction    . Liver Extract (LIVER PO) Take 1 tablet by mouth daily.    Marland Kitchen LYSINE PO Take 1 capsule by mouth daily.    . Omega-3 Fatty Acids (FISH OIL PO) Take 1 capsule by mouth daily.    . pantoprazole (PROTONIX) 40 MG tablet Take 1 tablet (40 mg total) by mouth 2 (two) times daily. 280 tablet 3  . POTASSIUM CITRATE-CITRIC ACID PO Take by mouth daily.    Marland Kitchen  rosuvastatin (CRESTOR) 40 MG tablet Take 40 mg by mouth daily.    . TURMERIC PO Take 1 capsule by mouth daily.     Current Facility-Administered Medications  Medication Dose Route Frequency Provider Last Rate Last Admin  . 0.9 %  sodium chloride infusion  500 mL Intravenous Once Marvelous Woolford V, DO        Physical Exam:       BP 140/80 (BP Location: Left Arm, Patient Position: Sitting, Cuff Size: Normal)   Pulse 67   Ht 6\' 2"  (1.88 m)   Wt 196 lb 2 oz (89 kg)   BMI 25.18 kg/m   GENERAL:  Pleasant male in NAD PSYCH: : Cooperative, normal affect SKIN:  turgor, no lesions seen Musculoskeletal:  Normal muscle tone, normal strength NEURO: Alert and oriented x 3, no focal neurologic deficits   IMPRESSION and PLAN:    1) Family History of Pancreatic Cancer: 54 year old male with a strong family history of Pancreatic Cancer.    Previously reviewed the most recent [place] and AGA guidelines, with screening for pancreatic cancer recommended in certain high risk individuals, and he has initiated PC screening protocol.  Mr. Dirosa meets screening criteria based on the following:  -Individuals with at least one first-degree relative with pancreatic CA who in turn also has a first-degree relative with pancreatic CA (?2 affected, genetically-related family members; familial pancreatic cancer [facility]) -Please see note/17/21 for full outlined above screening recommendations for Familial Pancreatic Cancer [facility]  Plan: -MRI/MRCP up-to-date -Referral to Dr. Ardis Hughs or Dr. 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  No history of pancreatitis and no clinical features of chronic pancreatitis, and no features of chronic pancreatitis on recent MRCP.  Can certainly evaluate for endosonographic features of chronic pancreatitis at time of EUS.  3) Peptic stricture 4) GERD with Erosive Esophagitis -Well-controlled on current PPI therapy -Dysphagia resolved with esophageal balloon dilations -Repeat EGD as needed  5) Elevated liver enzymes -Elevated liver enzymes in 07/2019, ALT>AST, suggestive of fatty liver infiltration.   Discussed etiology for that at length today, and can be multifactorial related to both EtOH along with hypermetabolic syndrome (has history of HLD). -Continue regular exercise.  Discussed some dietary modifications that may be beneficial -Discussed cutting down on EtOH -No radiographic or serologic or clinical features of impaired hepatic synthetic function -Repeat liver enzymes now.  If downtrending, continue conservative management -Liver enzymes uptrending, can plan for RUQ [place]  +/- elastography   RTC in 6 months or sooner as needed  I spent 30 minutes of time, including in depth chart review, independent review of results as outlined above, communicating results with the patient directly, face-to-face time with the patient, coordinating care, and ordering studies and medications as appropriate, and documentation.           Dominic Pea Kolby Myung ,DO, FACG 01/20/2020, 11:24 AM

## 2020-01-20 NOTE — Telephone Encounter (Signed)
-----   Message from Milus Banister, MD sent at 01/20/2020  3:28 PM EDT -----   ----- Message ----- From: Audrea Muscat, CMA Sent: 01/20/2020   2:46 PM EDT To: Milus Banister, MD, #

## 2020-01-20 NOTE — Progress Notes (Signed)
Luanna Salk, thanks  [place], He needs upper EUS with either myself or GM sometime this September for high risk pancreatic cancer screening.  Thanks  [place], [place]

## 2020-01-21 ENCOUNTER — Other Ambulatory Visit: Payer: Self-pay

## 2020-01-21 DIAGNOSIS — Z800 Family history of malignant neoplasm of digestive organs: Secondary | ICD-10-CM

## 2020-01-21 NOTE — Telephone Encounter (Signed)
EUS scheduled for 03/08/20 at 845 am at [facility] with Dr Rush Landmark COVID test on 9/16 at 905 am.  Left message on machine to call back

## 2020-01-21 NOTE — Telephone Encounter (Signed)
The pt returned call and has been given the appt information.  All information was also sent to My Chart.

## 2020-03-04 ENCOUNTER — Other Ambulatory Visit (HOSPITAL_COMMUNITY)
Admission: RE | Admit: 2020-03-04 | Discharge: 2020-03-04 | Disposition: A | Discharge status: Home / Self Care | Payer: 59 | Source: Ambulatory Visit | Attending: Gastroenterology | Admitting: Gastroenterology

## 2020-03-04 DIAGNOSIS — Z01812 Encounter for preprocedural laboratory examination: Secondary | ICD-10-CM | POA: Insufficient documentation

## 2020-03-04 DIAGNOSIS — Z20822 Contact with and (suspected) exposure to covid-19: Secondary | ICD-10-CM | POA: Insufficient documentation

## 2020-03-04 LAB — SARS CORONAVIRUS 2 (TAT 6-24 HRS): SARS Coronavirus 2: NEGATIVE

## 2020-03-05 ENCOUNTER — Other Ambulatory Visit: Payer: Self-pay

## 2020-03-05 ENCOUNTER — Encounter (HOSPITAL_COMMUNITY): Payer: Self-pay | Admitting: Gastroenterology

## 2020-03-05 NOTE — Progress Notes (Signed)
Patient denies shortness of breath, fever, cough or chest pain.  PCP - Dr Deland Pretty Cardiologist - n/a Gertie [facility]ey - Dr Bryan Lemma  Chest x-ray - n/a EKG - n/a Stress Test - n/a ECHO - n/a Cardiac Cath - n/a  Aspirin Instructions: Follow your surgeon's instructions on when to stop aspirin prior to surgery,  If no instructions were given by your surgeon then you will need to call the office for those instructions.  STOP now taking any Aspirin (unless otherwise instructed by your surgeon), Aleve, Naproxen, Ibuprofen, Motrin, Advil, Goody's, BC's, all herbal medications, fish oil, and all vitamins.   Coronavirus Screening Covid test on 03/04/20 was negative.  Patient verbalized understanding of instructions that were given via phone.

## 2020-03-08 ENCOUNTER — Ambulatory Visit (HOSPITAL_COMMUNITY): Payer: 59 | Admitting: Certified Registered"

## 2020-03-08 ENCOUNTER — Encounter (HOSPITAL_COMMUNITY)
Admission: RE | Disposition: A | Discharge status: Home / Self Care | Payer: Self-pay | Source: Home / Self Care | Attending: Gastroenterology

## 2020-03-08 ENCOUNTER — Ambulatory Visit (HOSPITAL_COMMUNITY)
Admission: RE | Admit: 2020-03-08 | Discharge: 2020-03-08 | Disposition: A | Discharge status: Home / Self Care | Payer: 59 | Attending: Gastroenterology | Admitting: Gastroenterology

## 2020-03-08 ENCOUNTER — Encounter (HOSPITAL_COMMUNITY): Payer: Self-pay | Admitting: Gastroenterology

## 2020-03-08 ENCOUNTER — Other Ambulatory Visit: Payer: Self-pay | Admitting: Physician Assistant

## 2020-03-08 DIAGNOSIS — K2970 Gastritis, unspecified, without bleeding: Secondary | ICD-10-CM

## 2020-03-08 DIAGNOSIS — K228 Other specified diseases of esophagus: Secondary | ICD-10-CM | POA: Diagnosis not present

## 2020-03-08 DIAGNOSIS — Z91030 Bee allergy status: Secondary | ICD-10-CM | POA: Insufficient documentation

## 2020-03-08 DIAGNOSIS — R1310 Dysphagia, unspecified: Secondary | ICD-10-CM

## 2020-03-08 DIAGNOSIS — K219 Gastro-esophageal reflux disease without esophagitis: Secondary | ICD-10-CM | POA: Insufficient documentation

## 2020-03-08 DIAGNOSIS — Z888 Allergy status to other drugs, medicaments and biological substances status: Secondary | ICD-10-CM | POA: Insufficient documentation

## 2020-03-08 DIAGNOSIS — Z87892 Personal history of anaphylaxis: Secondary | ICD-10-CM | POA: Insufficient documentation

## 2020-03-08 DIAGNOSIS — Z91018 Allergy to other foods: Secondary | ICD-10-CM | POA: Diagnosis not present

## 2020-03-08 DIAGNOSIS — Z91013 Allergy to seafood: Secondary | ICD-10-CM | POA: Diagnosis not present

## 2020-03-08 DIAGNOSIS — Z85828 Personal history of other malignant neoplasm of skin: Secondary | ICD-10-CM | POA: Insufficient documentation

## 2020-03-08 DIAGNOSIS — K2100 Gastro-esophageal reflux disease with esophagitis, without bleeding: Secondary | ICD-10-CM

## 2020-03-08 DIAGNOSIS — Z880 Allergy status to penicillin: Secondary | ICD-10-CM | POA: Insufficient documentation

## 2020-03-08 DIAGNOSIS — K449 Diaphragmatic hernia without obstruction or gangrene: Secondary | ICD-10-CM

## 2020-03-08 DIAGNOSIS — K3189 Other diseases of stomach and duodenum: Secondary | ICD-10-CM

## 2020-03-08 DIAGNOSIS — K869 Disease of pancreas, unspecified: Secondary | ICD-10-CM | POA: Diagnosis not present

## 2020-03-08 DIAGNOSIS — K200 Eosinophilic esophagitis: Secondary | ICD-10-CM | POA: Diagnosis not present

## 2020-03-08 DIAGNOSIS — Z1289 Encounter for screening for malignant neoplasm of other sites: Secondary | ICD-10-CM | POA: Diagnosis present

## 2020-03-08 DIAGNOSIS — K209 Esophagitis, unspecified without bleeding: Secondary | ICD-10-CM

## 2020-03-08 DIAGNOSIS — Z800 Family history of malignant neoplasm of digestive organs: Secondary | ICD-10-CM

## 2020-03-08 HISTORY — PX: ESOPHAGOGASTRODUODENOSCOPY: SHX5428

## 2020-03-08 HISTORY — PX: EUS: SHX5427

## 2020-03-08 HISTORY — PX: BIOPSY: SHX5522

## 2020-03-08 SURGERY — UPPER ENDOSCOPIC ULTRASOUND (EUS) RADIAL
Anesthesia: Monitor Anesthesia Care

## 2020-03-08 MED ORDER — PROPOFOL 10 MG/ML IV BOLUS
INTRAVENOUS | Status: DC | PRN
  Administered 2020-03-08 (×2): 30 mg via INTRAVENOUS
  Administered 2020-03-08: 25 mg via INTRAVENOUS

## 2020-03-08 MED ORDER — PANTOPRAZOLE SODIUM 40 MG PO TBEC: 40.0000 mg | DELAYED_RELEASE_TABLET | Freq: Two times a day (BID) | ORAL | 3 refills | Status: DC

## 2020-03-08 MED ORDER — LACTATED RINGERS IV SOLN: INTRAVENOUS | Status: DC

## 2020-03-08 MED ORDER — SODIUM CHLORIDE 0.9 % IV SOLN: INTRAVENOUS | Status: DC

## 2020-03-08 MED ORDER — PROPOFOL 500 MG/50ML IV EMUL
INTRAVENOUS | Status: DC | PRN
  Administered 2020-03-08: 100 ug/kg/min via INTRAVENOUS

## 2020-03-08 MED ORDER — ACETAMINOPHEN 160 MG/5ML PO SOLN: 325.0000 mg | ORAL | Status: DC | PRN

## 2020-03-08 MED ORDER — ONDANSETRON HCL 4 MG/2ML IJ SOLN: 4.0000 mg | Freq: Once | INTRAMUSCULAR | Status: DC | PRN

## 2020-03-08 MED ORDER — ACETAMINOPHEN 325 MG PO TABS: 325.0000 mg | ORAL_TABLET | ORAL | Status: DC | PRN

## 2020-03-08 MED ORDER — FENTANYL CITRATE (PF) 100 MCG/2ML IJ SOLN: 25.0000 ug | INTRAMUSCULAR | Status: DC | PRN

## 2020-03-08 NOTE — Anesthesia Procedure Notes (Signed)
Procedure Name: MAC Date/Time: 03/08/2020 8:54 AM Performed by: Amadeo Garnet, CRNA Pre-anesthesia Checklist: Patient identified, Emergency Drugs available, Suction available, Patient being monitored and Timeout performed Patient Re-evaluated:Patient Re-evaluated prior to induction Oxygen Delivery Method: Nasal cannula Preoxygenation: Pre-oxygenation with 100% oxygen Induction Type: IV induction Placement Confirmation: positive ETCO2 Dental Injury: Teeth and Oropharynx as per pre-operative assessment

## 2020-03-08 NOTE — Anesthesia Preprocedure Evaluation (Signed)
Anesthesia Evaluation  Patient identified by MRN, date of birth, ID band  Reviewed: Patient's Chart, lab work & pertinent test results  Airway Mallampati: II  TM Distance: >3 FB Neck ROM: Full    Dental  (+) Teeth Intact   Pulmonary neg pulmonary ROS,    Pulmonary exam normal        Cardiovascular negative cardio ROS   Rhythm:Regular Rate:Normal     Neuro/Psych negative neurological ROS  negative psych ROS   GI/Hepatic Neg liver ROS, GERD  Medicated,Family history of pancreatic cancer, high risk screening   Endo/Other  negative endocrine ROS  Renal/GU negative Renal ROS  negative genitourinary   Musculoskeletal negative musculoskeletal ROS (+)   Abdominal (+)  Abdomen: soft. Bowel sounds: normal.  Peds negative pediatric ROS (+)  Hematology   Anesthesia Other Findings   Reproductive/Obstetrics                             Anesthesia Physical Anesthesia Plan  ASA: II  Anesthesia Plan: MAC   Post-op Pain Management:    Induction:   PONV Risk Score and Plan: 1 and Propofol infusion  Airway Management Planned: Nasal Cannula and Simple Face Mask  Additional Equipment: None  Intra-op Plan:   Post-operative Plan:   Informed Consent: I have reviewed the patients History and Physical, chart, labs and discussed the procedure including the risks, benefits and alternatives for the proposed anesthesia with the patient or authorized representative who has indicated his/her understanding and acceptance.       Plan Discussed with: CRNA and Surgeon  Anesthesia Plan Comments: [id number] Nucleic Acid Test Results Lab Results      Component                Value               Date                      [id number]              NEGATIVE            03/04/2020                [place]              NEGATIVE            09/13/2019                [place]              Not Detected         02/28/2019          )        Anesthesia Quick Evaluation

## 2020-03-08 NOTE — Transfer of Care (Signed)
Immediate Anesthesia Transfer of Care Note  Patient: Francisco Phillips  Procedure(s) Performed: UPPER ENDOSCOPIC ULTRASOUND (EUS) RADIAL (N/A ) BIOPSY ESOPHAGOGASTRODUODENOSCOPY (EGD) (N/A )  Patient Location: PACU  Anesthesia Type:MAC  Level of Consciousness: awake, alert  and oriented  Airway & Oxygen Therapy: Patient Spontanous Breathing and Patient connected to nasal cannula oxygen  Post-op Assessment: Report given to RN, Post -op Vital signs reviewed and stable and Patient moving all extremities  Post vital signs: Reviewed and stable  Last Vitals:  Vitals Value Taken Time  BP 102/70 03/08/20 0945  Temp    Pulse 55 03/08/20 0947  Resp 17 03/08/20 0947  SpO2 98 % 03/08/20 0947  Vitals shown include unvalidated device data.  Last Pain:  Vitals:   03/08/20 0755  TempSrc: Oral  PainSc: 0-No pain         Complications: No complications documented.

## 2020-03-08 NOTE — Anesthesia Postprocedure Evaluation (Signed)
Anesthesia Post Note  Patient: Francisco Phillips  Procedure(s) Performed: UPPER ENDOSCOPIC ULTRASOUND (EUS) RADIAL (N/A ) BIOPSY ESOPHAGOGASTRODUODENOSCOPY (EGD) (N/A )     Patient location during evaluation: PACU Anesthesia Type: MAC Level of consciousness: awake and alert Pain management: pain level controlled Vital Signs Assessment: post-procedure vital signs reviewed and stable Respiratory status: spontaneous breathing, nonlabored ventilation, respiratory function stable and patient connected to nasal cannula oxygen Cardiovascular status: stable and blood pressure returned to baseline Postop Assessment: no apparent nausea or vomiting Anesthetic complications: no   No complications documented.  Last Vitals:  Vitals:   03/08/20 1000 03/08/20 1030  BP: 111/70 118/88  Pulse: (!) 52 (!) 50  Resp: 18 16  Temp:  36.6 C  SpO2: 97% 98%    Last Pain:  Vitals:   03/08/20 1045  TempSrc:   PainSc: 0-No pain                 Belenda Cruise P Aurianna Earlywine

## 2020-03-08 NOTE — Op Note (Signed)
[facility] Patient Name: Francisco Phillips Procedure Date : 03/08/2020 MRN: [id number] Attending MD: Justice Britain , MD Date of Birth: 02/06/1966 CSN: [id number] Age: 54 Admit Type: Outpatient Procedure:                Upper EUS Indications:              Screening for pancreatic neoplasm (High Risk                            Cohort), Heartburn Providers:                Justice Britain, MD, Burtis Junes, RN, Cletis Athens,                            Technician Referring MD:             Gerrit Heck, MD Medicines:                Monitored Anesthesia Care Complications:            No immediate complications. Estimated Blood Loss:     Estimated blood loss was minimal. Procedure:                Pre-Anesthesia Assessment:                           - Prior to the procedure, a History and Physical                            was performed, and patient medications and                            allergies were reviewed. The patient's tolerance of                            previous anesthesia was also reviewed. The risks                            and benefits of the procedure and the sedation                            options and risks were discussed with the patient.                            All questions were answered, and informed consent                            was obtained. Prior Anticoagulants: The patient has                            taken no previous anticoagulant or antiplatelet                            agents. ASA Grade Assessment: II - A patient with  mild systemic disease. After reviewing the risks                            and benefits, the patient was deemed in                            satisfactory condition to undergo the procedure.                           After obtaining informed consent, the endoscope was                            passed under direct vision. Throughout the                            procedure, the  patient's blood pressure, pulse, and                            oxygen saturations were monitored continuously. The                            [id number]-H190 ([id number]) Olympus gastroscope was                            introduced through the mouth, and advanced to the                            second part of duodenum. The [id number]-[id number] ([id number])                            Lawrence was introduced through the                            mouth, and advanced to the second part of duodenum.                            The [id number] ([id number]) Olympus Linear EUS was                            introduced through the mouth, and advanced to the                            duodenum for ultrasound examination from the                            stomach and duodenum. The upper EUS was                            accomplished without difficulty. The patient                            tolerated the procedure. Scope In: Scope Out: Findings:      ENDOSCOPIC FINDING: :      White nummular lesions were noted in  the proximal esophagus and in the       mid esophagus. Biopsies were taken with a cold forceps for histology to       rule out Candida.      Localized mild mucosal changes characterized by discoloration,       granularity and altered texture were found in the distal esophagus.       Biopsies were taken with a cold forceps for histology to rule out       EoE/Lichen.      LA Grade B (one or more mucosal breaks greater than 5 mm, not extending       between the tops of two mucosal folds) esophagitis with no bleeding was       found in the distal esophagus to the GE Junction.      Striped mildly erythematous mucosa without bleeding was found in the       gastric antrum.      No other gross lesions were noted in the entire examined stomach.       Biopsies were taken with a cold forceps for histology and Helicobacter       pylori testing.      No gross lesions were noted in the duodenal bulb, in the  first portion       of the duodenum, in the second portion of the duodenum and in the major       papilla.      ENDOSONOGRAPHIC FINDING: :      Pancreatic parenchymal abnormalities were noted in the entire pancreas.       These consisted of lobularity with honeycombing and hyperechoic strands.      The pancreatic duct had a normal endosonographic appearance in the       pancreatic head (1.4 mm -> 1.2 mm), genu of the pancreas (1.0 mm -> 0.7       mm), body of the pancreas (0.5 mm) and tail of the pancreas (0.4 mm).      There was no sign of significant endosonographic abnormality in the       common bile duct (2.5 mm) and in the common hepatic duct. An       unremarkable gallbladder was identified.      Endosonographic imaging in the visualized portion of the liver showed no       mass.      No malignant-appearing lymph nodes were visualized in the celiac region       (level 20), perigastric region, peripancreatic region and porta hepatis       region.      The celiac region was visualized. Impression:               EGD Impression:                           - White nummular lesions in esophageal mucosa.                            Biopsied to rule out Candida.                           - Discolored, granular, texture changed mucosa in                            the esophagus. Biopsied.                           -  LA Grade B reflux esophagitis with no bleeding                            distally.                           - Erythematous mucosa in the antrum. No other gross                            lesions in the stomach. Biopsied.                           - No gross lesions in the duodenal bulb, in the                            first portion of the duodenum, in the second                            portion of the duodenum and in the major papilla.                           EUS Impression:                           - Pancreatic parenchymal abnormalities consisting                             of lobularity with honeycombing and hyperechoic                            strands were noted in the entire pancreas.                           - The pancreatic duct had a normal endosonographic                            appearance in the pancreatic head, genu of the                            pancreas, body of the pancreas and tail of the                            pancreas.                           - There was no sign of significant pathology in the                            common bile duct and in the common hepatic duct.                           - No malignant-appearing lymph nodes were  visualized in the celiac region (level 20),                            perigastric region, peripancreatic region and porta                            hepatis region.                           - Based on EUS [place] Criteria, the patient has 1                            Major B criteria and 1 Minor criteria. 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 Recommendation:           - The patient will be observed post-procedure,                            until all discharge criteria are met.                           - Discharge patient to home.                           - Patient has a contact number available for                            emergencies. The signs and symptoms of potential                            delayed complications were discussed with the                            patient. Return to normal activities tomorrow.                            Written discharge instructions were provided to the                            patient.                           - Resume previous diet.                           - Observe patient's clinical course.                           -  Await path results.                            - Recommend restarting twice daily PPI therapy for                            at least next 4-weeks and then based on symptoms                            consider twice daily dosing v decreasing further                            based on symptom management.                           - Observe patient's clinical course.                           - MRI/MRCP in 51-month. If no issues found there                            then recommend 1 year EGD/EUS in setting of                            High-Risk Pancreatic Cancer Screening Cohort.                           - Consider Fecal elastase testing.                           - The findings and recommendations were discussed                            with the patient. Procedure Code(s):        --- Professional ---                           [id number] Esophagogastroduodenoscopy, flexible,                            transoral; with endoscopic ultrasound examination                            limited to the esophagus, stomach or duodenum, and                            adjacent structures                           43239, Esophagogastroduodenoscopy, flexible,                            transoral; with biopsy, single or multiple Diagnosis Code(s):        --- Professional ---  K22.8, Other specified diseases of esophagus                           K21.00, Gastro-esophageal reflux disease with                            esophagitis, without bleeding                           K31.89, Other diseases of stomach and duodenum                           K86.9, Disease of pancreas, unspecified                           I89.9, Noninfective disorder of lymphatic vessels                            and lymph nodes, unspecified                           Z12.89, Encounter for screening for malignant                            neoplasm of other sites                           R12, Heartburn CPT copyright 2019 American Medical  Association. All rights reserved. The codes documented in this report are preliminary and upon coder review may  be revised to meet current compliance requirements. Justice Britain, MD 03/08/2020 10:03:03 AM Number of Addenda: 0

## 2020-03-08 NOTE — H&P (Signed)
GASTROENTEROLOGY PROCEDURE H&P NOTE   Primary Care Physician: Deland Pretty, MD  HPI: Francisco Phillips is a 54 y.o. male who presents for EGD/EUS for High-Risk Pancreatic Cancer Screening.  Past Medical History:  Diagnosis Date  . Allergy    seasonal  . Cancer ([place])    basal cell carcinoma face/right chest/left shoulder  . Family history of breast cancer in male   . Family history of cystic fibrosis   . Family history of pancreatic cancer   . GERD (gastroesophageal reflux disease)   . Hyperlipidemia    Past Surgical History:  Procedure Laterality Date  . COLONOSCOPY     With [facility]   . ESOPHAGOGASTRODUODENOSCOPY     Dilation Earle Gell with [facility] long time ago  . TONSILLECTOMY    . UPPER GASTROINTESTINAL ENDOSCOPY  08/2019   Current Facility-Administered Medications  Medication Dose Route Frequency Provider Last Rate Last Admin  . 0.9 %  sodium chloride infusion   Intravenous Continuous Mansouraty, Telford Nab., MD      . lactated ringers infusion   Intravenous Continuous Mansouraty, Telford Nab., MD 10 mL/hr at 03/08/20 0800 New Bag at 03/08/20 0800   Allergies  Allergen Reactions  . Bee Venom Anaphylaxis, Hives and Rash  . Kiwi Extract Anaphylaxis  . Other Anaphylaxis, Hives and Rash    Kiwi,walnuts,spinach,scallops,poppey seeds  . Penicillins     Said he can't do it and would prefer another medication  Childhood  . Lamictal [Lamotrigine] Rash   Family History  Problem Relation Age of Onset  . Pancreatic cancer Mother 59  . Breast cancer Father 65  . Pancreatic cancer Maternal Grandmother 31  . Pancreatic cancer Maternal Uncle 61  . Dementia Paternal Grandfather   . Colon cancer Neg Hx   . Esophageal cancer Neg Hx   . Rectal cancer Neg Hx   . Stomach cancer Neg Hx   . Colon polyps Neg Hx    Social History   Socioeconomic History  . Marital status: Divorced    Spouse name: Not on file  . Number of  children: Not on file  . Years of education: Not on file  . Highest education level: Not on file  Occupational History  . Not on file  Tobacco Use  . Smoking status: Never Smoker  . Smokeless tobacco: Never Used  Vaping Use  . Vaping Use: Never used  Substance and Sexual Activity  . Alcohol use: Yes    Alcohol/week: 21.0 standard drinks    Types: 21 Glasses of wine per week    Comment: 1/2 bottle wine per night   . Drug use: No  . Sexual activity: Yes  Other Topics Concern  . Not on file  Social History Narrative  . Not on file   Social Determinants of Health   Financial Resource Strain:   . Difficulty of Paying Living Expenses: Not on file  Food Insecurity:   . Worried About Charity fundraiser in the Last Year: Not on file  . Ran Out of Food in the Last Year: Not on file  Transportation Needs:   . Lack of Transportation (Medical): Not on file  . Lack of Transportation (Non-Medical): Not on file  Physical Activity:   . Days of Exercise per Week: Not on file  . Minutes of Exercise per Session: Not on file  Stress:   . Feeling of Stress : Not on file  Social Connections:   . Frequency of  Communication with Friends and Family: Not on file  . Frequency of Social Gatherings with Friends and Family: Not on file  . Attends Religious Services: Not on file  . Active Member of Clubs or Organizations: Not on file  . Attends Archivist Meetings: Not on file  . Marital Status: Not on file  Intimate Partner Violence:   . Fear of Current or Ex-Partner: Not on file  . Emotionally Abused: Not on file  . Physically Abused: Not on file  . Sexually Abused: Not on file    Physical Exam: Vital signs in last 24 hours: Temp:  [97.9 F (36.6 C)] 97.9 F (36.6 C) (09/20 0755) Resp:  [13] 13 (09/20 0755) BP: (130)/(85) 130/85 (09/20 0755) SpO2:  [97 %] 97 % (09/20 0755) Weight:  [86.2 kg] 86.2 kg (09/20 0755)   GEN: NAD EYE: Sclerae anicteric ENT: MMM CV:  Non-tachycardic GI: Soft, NT/ND NEURO:  Alert & Oriented x 3  Lab Results: No results for input(s): WBC, HGB, HCT, PLT in the last 72 hours. BMET No results for input(s): NA, K, CL, CO2, GLUCOSE, BUN, CREATININE, CALCIUM in the last 72 hours. LFT No results for input(s): PROT, ALBUMIN, AST, ALT, ALKPHOS, BILITOT, BILIDIR, IBILI in the last 72 hours. PT/INR No results for input(s): LABPROT, INR in the last 72 hours.   Impression / Plan: This is a 54 y.o.male who presents for EGD/EUS for High-Risk Pancreatic Cancer Screening.  The risks of an EUS including intestinal perforation, bleeding, infection, aspiration, and medication effects were discussed as was the possibility it may not give a definitive diagnosis if a biopsy is performed.  When a biopsy of the pancreas is done as part of the EUS, there is an additional risk of pancreatitis at the rate of about 1-2%.  It was explained that procedure related pancreatitis is typically mild, although it can be severe and even life threatening, which is why we do not perform random pancreatic biopsies and only biopsy a lesion/area we feel is concerning enough to warrant the risk.  The risks and benefits of endoscopic evaluation were discussed with the patient; these include but are not limited to the risk of perforation, infection, bleeding, missed lesions, lack of diagnosis, severe illness requiring hospitalization, as well as anesthesia and sedation related illnesses.  The patient is agreeable to proceed.    Justice Britain, MD [facility] Office # [phone]

## 2020-03-09 ENCOUNTER — Encounter: Payer: Self-pay | Admitting: Gastroenterology

## 2020-03-09 LAB — SURGICAL PATHOLOGY

## 2020-03-10 ENCOUNTER — Encounter (HOSPITAL_COMMUNITY): Payer: Self-pay | Admitting: Gastroenterology

## 2020-09-06 ENCOUNTER — Telehealth: Payer: Self-pay

## 2020-09-06 NOTE — Telephone Encounter (Signed)
LVM for patient to call back  Needs to schedule OV for MRI-MRCP, 6 month followup

## 2020-09-20 ENCOUNTER — Other Ambulatory Visit: Payer: Self-pay | Admitting: Internal Medicine

## 2020-09-20 DIAGNOSIS — R972 Elevated prostate specific antigen [PSA]: Secondary | ICD-10-CM

## 2020-10-07 ENCOUNTER — Telehealth: Payer: Self-pay | Admitting: Gastroenterology

## 2020-10-07 NOTE — Telephone Encounter (Signed)
Received fax from patient's primary care, Dr. Shelia Media with recent FOBT positive on routine stool testing for CRC screening.  Patient has appointment already scheduled with me on 10/26/2020 at 920 to discuss these results.  Previous Cologuard last year was negative, but in light of positive FOBT, likely offer colonoscopy for CRC screening.

## 2020-10-26 ENCOUNTER — Ambulatory Visit: Payer: 59 | Admitting: Gastroenterology

## 2020-11-18 ENCOUNTER — Other Ambulatory Visit: Payer: Self-pay | Admitting: Internal Medicine

## 2020-11-18 ENCOUNTER — Inpatient Hospital Stay: Admission: RE | Admit: 2020-11-18 | Payer: 59 | Source: Ambulatory Visit

## 2020-11-18 DIAGNOSIS — Z139 Encounter for screening, unspecified: Secondary | ICD-10-CM

## 2020-12-01 ENCOUNTER — Encounter: Payer: Self-pay | Admitting: Gastroenterology

## 2020-12-01 ENCOUNTER — Other Ambulatory Visit: Payer: Self-pay

## 2020-12-01 ENCOUNTER — Ambulatory Visit (INDEPENDENT_AMBULATORY_CARE_PROVIDER_SITE_OTHER): Payer: 59 | Admitting: Gastroenterology

## 2020-12-01 VITALS — BP 118/78 | HR 64 | Wt 199.2 lb

## 2020-12-01 DIAGNOSIS — K222 Esophageal obstruction: Secondary | ICD-10-CM

## 2020-12-01 DIAGNOSIS — R195 Other fecal abnormalities: Secondary | ICD-10-CM | POA: Diagnosis not present

## 2020-12-01 DIAGNOSIS — Z800 Family history of malignant neoplasm of digestive organs: Secondary | ICD-10-CM

## 2020-12-01 DIAGNOSIS — Z141 Cystic fibrosis carrier: Secondary | ICD-10-CM

## 2020-12-01 DIAGNOSIS — K2100 Gastro-esophageal reflux disease with esophagitis, without bleeding: Secondary | ICD-10-CM

## 2020-12-01 NOTE — Progress Notes (Signed)
Chief Complaint:    FOBT positive stool, dysphagia  GI History: 55 year old male initially seen in 08/2019 as a referral for Pancreatic Cancer screening due to strong family history of PC.   Family history notable for the following: -Mother: Pancreatic cancer, age 5. Deceased -Father: Breast cancer, age 72 -Maternal grandmother: Pancreatic cancer, age 40 -Maternal uncle: Pancreatic cancer, age 21 -His 3 sons, his brother, and his sister are all cancer free. -Sister's daughter (his niece) with cystic fibrosis. 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  An additional variant of uncertain significance (VUS) identified in the [facility] 1 gene called c.535A>G.   Pancreatic Cancer screening: -MRI/MRCP (08/2019): Normal.  Due for repeat.  Order placed today -EUS: (02/2020): Pancreatic parenchyma abnormalities consisting of lobularity with honeycombing and hyperechoic strands.  Normal PD (1 major B criteria and 1 minor criteria per [place], classified as Indeterminate for Chronic Pancreatitis)   Separately, history of solid food dysphagia along with heartburn/regurgitation.  Diagnosed with GERD with erosive esophagitis and distal esophageal stricture on EGD in 08/2019.  Treated with TTS balloon dilation and high-dose PPI with good clinical response.  Repeat EGD in 11/2019 with mucosal improvement in further dilated with 19 mm TTS balloon with resolution of symptoms.   Separately, recently noted elevated liver enzymes (AST/ALT).  Normal albumin, T bili, ALP.  Normal BMP and CBC.  Attributed this to increased EtOH consumption during COVID-19 pandemic. -06/11/2019: AST/ALT 43/57 -07/21/2019: AST/ALT 79/132 -08/18/2019: AST/ALT 51/90     Endoscopic History: -EGD many years ago by Dr. Wynetta Emery at [facility] with reported dilation -Colonoscopy many years ago by Dr. Wynetta Emery as well and reportedly negative/normal -Cologuard (2021): Negative -EGD (09/16/2019,  Dr. Bryan Lemma): 4 cm HH, distal esophageal stenosis dilated with 15.5 mm TTS balloon, LA Grade B esophagitis, Hill grade 4 valve, mild gastritis.  Treated with high-dose PPI -EGD (12/09/2019, Dr. Bryan Lemma): 4 cm HH, distal esophageal stricture dilated with 19 mm TTS balloon.  Esophageal biopsies c/w GERD and negative for EoE.  Gastritis resolved. - EGD/EUS (02/2020): LA Grade B esophagitis.  EUS findings as above  HPI:     Patient is a 55 y.o. male presenting to the Gastroenterology Clinic to discuss FOBT positive stool on recent routine screening done by [facility], Dr. Shelia Media.  He did have a Cologuard in 2021 that was negative.  Otherwise no recent hematochezia or change in bowel habits.  Dysphagia has resolved with prior EGD with dilations.  He is otherwise without any complaints today.  Review of systems:     No chest pain, no SOB, no fevers, no urinary sx   Past Medical History:  Diagnosis Date  . Allergy    seasonal  . Cancer ([place])    basal cell carcinoma face/right chest/left shoulder  . Family history of breast cancer in male   . Family history of cystic fibrosis   . Family history of pancreatic cancer   . GERD (gastroesophageal reflux disease)   . Hyperlipidemia     Patient's surgical history, family medical history, social history, medications and allergies were all reviewed in Epic    Current Outpatient Medications  Medication Sig Dispense Refill  . Acetylcysteine (NAC 600 PO) Take 600 mg by mouth daily.    Marland Kitchen acidophilus (RISAQUAD) CAPS capsule Take 1 capsule by mouth daily.    . Ascorbic Acid (VITA-C PO) Take 1 tablet by mouth daily.    Marland Kitchen aspirin EC 81 MG tablet Take 81 mg  by mouth daily. Swallow whole.    . cetirizine (ZYRTEC) 10 MG tablet Take 10 mg by mouth daily.    Marland Kitchen EPINEPHrine (EPIPEN 2-PAK IJ) Inject 1 each as directed as needed (Allergic reaction). For reaction     . Lysine 500 MG TABS Take 500 mg by mouth daily.     Marland Kitchen MILK THISTLE PO Take 1 tablet by mouth  daily.    . Omega-3 Fatty Acids (FISH OIL) 1200 MG CAPS Take 1,200 mg by mouth daily.     Marland Kitchen OVER THE COUNTER MEDICATION Take 1 tablet by mouth daily. Kenard Gower    . pantoprazole (PROTONIX) 40 MG tablet Take 1 tablet (40 mg total) by mouth 2 (two) times daily before a meal. 280 tablet 3  . rosuvastatin (CRESTOR) 40 MG tablet Take 40 mg by mouth daily.    . tadalafil (CIALIS) 20 MG tablet Take 20 mg by mouth daily as needed for erectile dysfunction.    . TURMERIC PO Take 1 capsule by mouth daily.    Marin Shutter HCL PO Take 5 mg by mouth daily.     Current Facility-Administered Medications  Medication Dose Route Frequency Provider Last Rate Last Admin  . 0.9 %  sodium chloride infusion  500 mL Intravenous Once Daleon Willinger V, DO        Physical Exam:     There were no vitals taken for this visit.  GENERAL:  Pleasant male in NAD PSYCH: : Cooperative, normal affect NEURO: Alert and oriented x 3, no focal neurologic deficits   IMPRESSION and PLAN:    1) FOBT positive stool - Plan for colonoscopy for further evaluation of FOBT positive stool on recent routine screening - Per patient request, I discussed his case with his PCM, Dr. Shelia Media, who agrees with plan for colonoscopy  2) Pancreatic Cancer Screening Strong family history of Pancreatic Cancer.    Previously reviewed the most recent [place] and AGA guidelines, with screening for pancreatic cancer recommended in certain high risk individuals, and he has initiated PC screening protocol.  Mr. Rowlette meets screening criteria based on the following:   -Individuals with at least one first-degree relative with pancreatic CA who in turn also has a first-degree relative with pancreatic CA (=2 affected, genetically-related family members; familial pancreatic cancer kindred)    Plan: -MRI/MRCP ordered today - UTD on EUS from 02/2020 -Follow-up every 6 months with rotating screening studies -Fasting blood glucose  and A1c yearly.  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  Perhaps some mild Indeterminate Chronic Pancreatitis changes on EUS from 02/2020.  Otherwise no history of pancreatitis and no features of chronic pancreatitis on recent MRCP.     4) Peptic stricture 5) GERD with Erosive Esophagitis -Well-controlled on current PPI therapy -Dysphagia resolved with esophageal balloon dilations -Repeat EGD as needed  The indications, risks, and benefits of colonoscopy were explained to the patient in detail. Risks include but are not limited to bleeding, perforation, adverse reaction to medications, and cardiopulmonary compromise. Sequelae include but are not limited to the possibility of surgery, hospitalization, and mortality. The patient verbalized understanding and wished to proceed. All questions answered, referred to the scheduler and bowel prep ordered. Further recommendations pending results of the exam.             Lavena Bullion ,DO, FACG 12/01/2020, 8:44 AM

## 2020-12-01 NOTE — Patient Instructions (Addendum)
If you are age 55 or older, your body mass index should be between 23-30. Your Body mass index is 24.9 kg/m. If this is out of the aforementioned range listed, please consider follow up with your Primary Care Provider.  If you are age 30 or younger, your body mass index should be between 19-25. Your Body mass index is 24.9 kg/m. If this is out of the aformentioned range listed, please consider follow up with your Primary Care Provider.   __________________________________________________________  The [facility] providers would like to encourage you to use [facility] to communicate with providers for non-urgent requests or questions.  Due to long hold times on the telephone, sending your provider a message by [facility] may be a faster and more efficient way to get a response.  Please allow 48 business hours for a response.  Please remember that this is for non-urgent requests.  _____________________________________________________________  Dennis Bast will be contacted by [facility] Scheduling in the next 2 days to arrange a MRI of the abdomen and pelvis.  The number on your caller ID will be [phone], please answer when they call.  If you have not heard from them in 2 days please call [phone] to schedule.

## 2020-12-06 ENCOUNTER — Other Ambulatory Visit: Payer: Self-pay | Admitting: Gastroenterology

## 2020-12-06 ENCOUNTER — Ambulatory Visit
Admission: RE | Admit: 2020-12-06 | Discharge: 2020-12-06 | Disposition: A | Discharge status: Home / Self Care | Payer: Self-pay | Source: Ambulatory Visit | Attending: Internal Medicine | Admitting: Internal Medicine

## 2020-12-06 ENCOUNTER — Ambulatory Visit (HOSPITAL_COMMUNITY)
Admission: RE | Admit: 2020-12-06 | Discharge: 2020-12-06 | Disposition: A | Discharge status: Home / Self Care | Payer: 59 | Source: Ambulatory Visit | Attending: Gastroenterology | Admitting: Gastroenterology

## 2020-12-06 ENCOUNTER — Other Ambulatory Visit: Payer: Self-pay

## 2020-12-06 DIAGNOSIS — K760 Fatty (change of) liver, not elsewhere classified: Secondary | ICD-10-CM | POA: Diagnosis not present

## 2020-12-06 DIAGNOSIS — Z1289 Encounter for screening for malignant neoplasm of other sites: Secondary | ICD-10-CM | POA: Diagnosis not present

## 2020-12-06 DIAGNOSIS — Z800 Family history of malignant neoplasm of digestive organs: Secondary | ICD-10-CM

## 2020-12-06 DIAGNOSIS — Z139 Encounter for screening, unspecified: Secondary | ICD-10-CM

## 2020-12-06 IMAGING — MR MR ABDOMEN WO/W CM MRCP
15 of 17 series · 43 of 48 positions shown · IV contrast (gadavist)
Comparison: MRI [DATE].

CLINICAL DATA: Strong family history of pancreatic neoplasm,
screening

EXAM:
MRI ABDOMEN WITHOUT AND WITH CONTRAST (INCLUDING MRCP)
TECHNIQUE: Multiplanar multisequence MR imaging of the abdomen was performed
both before and after the administration of intravenous contrast.
Heavily T2-weighted images of the biliary and pancreatic ducts were
obtained, and three-dimensional MRCP images were rendered by post
processing.
CONTRAST:  9mL GADAVIST GADOBUTROL 1 MMOL/ML IV SOLN

[Series 3: DWI · axial · 6.0mm · 1.49mm/px · z∈[-200,+88]mm · 3 of 82 slices shown (1 of 2)]
[im 1/82]
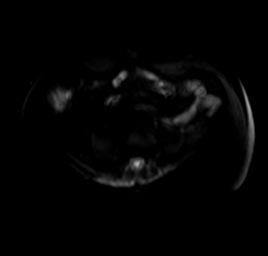
[im 41/82]
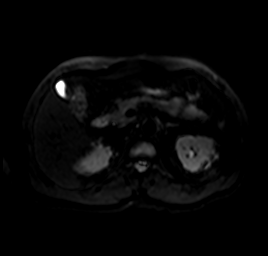
[im 82/82]
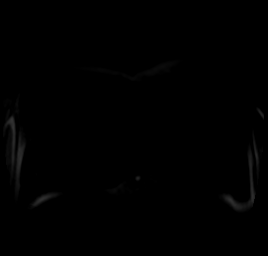

[Series 4: DWI · axial · 6.0mm · 1.49mm/px · 1 of 41 slices shown (2 of 2)]
[im 1/41]
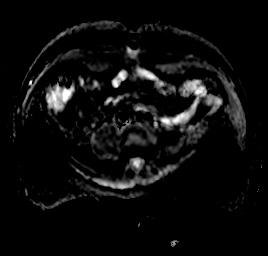

[Series 6: T2 fat-sat · axial · 6.0mm · 1.25mm/px · z∈[-197,+113]mm · 2 of 44 slices shown]
[im 1/44]
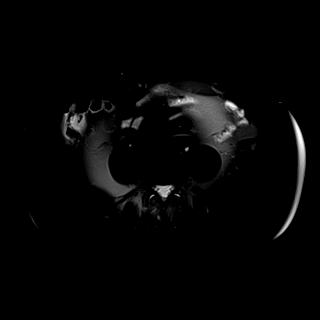
[im 44/44]
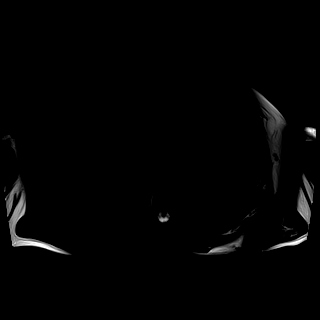

[Series 8: T2 · coronal · 6.0mm · 1.56mm/px · 2 of 38 slices shown (1 of 2)]
[im 1/38]
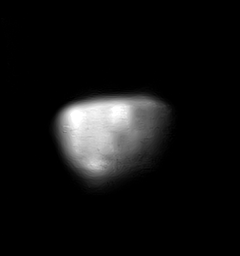
[im 38/38]
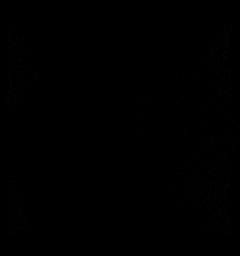

[Series 10: T1 · axial · 3.0mm · 1.25mm/px · z∈[-186,+99]mm · 4 of 96 slices shown (1 of 2)]
[im 1/96]
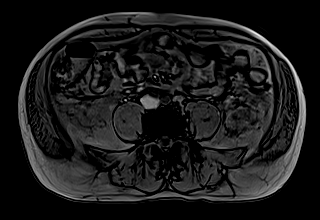
[im 32/96]
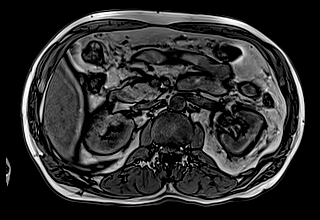
[im 64/96]
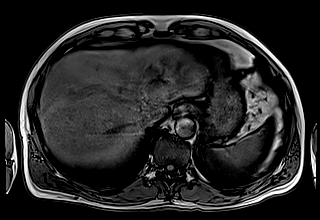
[im 96/96]
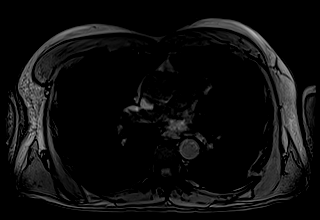

[Series 11: T1 · axial · 3.0mm · 1.25mm/px · z∈[-186,+99]mm · 4 of 96 slices shown (2 of 2)]
[im 1/96]
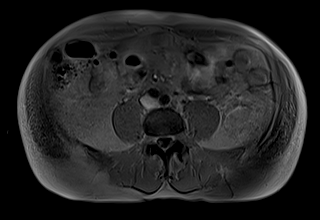
[im 32/96]
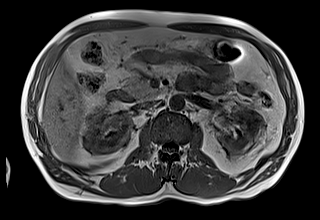
[im 64/96]
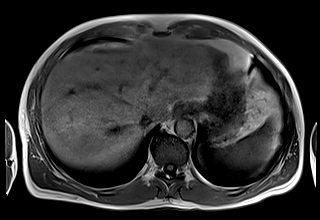
[im 96/96]
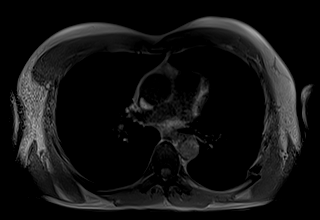

[Series 12: cor obl thk · sagittal · 50.0mm · 0.78mm/px · 1 of 9 slices shown]
[im 1/9]
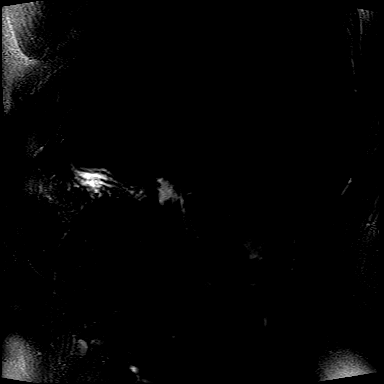

[Series 13: T2 · axial · 6.0mm · 1.56mm/px · 1 of 30 slices shown (2 of 2)]
[im 1/30]
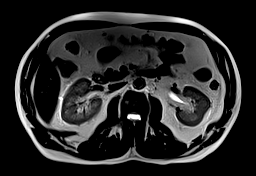

[Series 15: cor_3d_spc_trig · coronal · 1.0mm · 0.59mm/px · 4 of 80 slices shown]
[im 1/80]
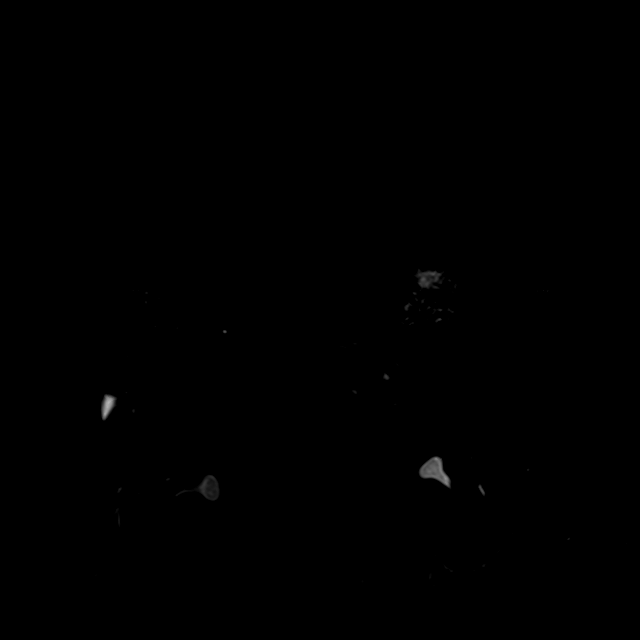
[im 27/80]
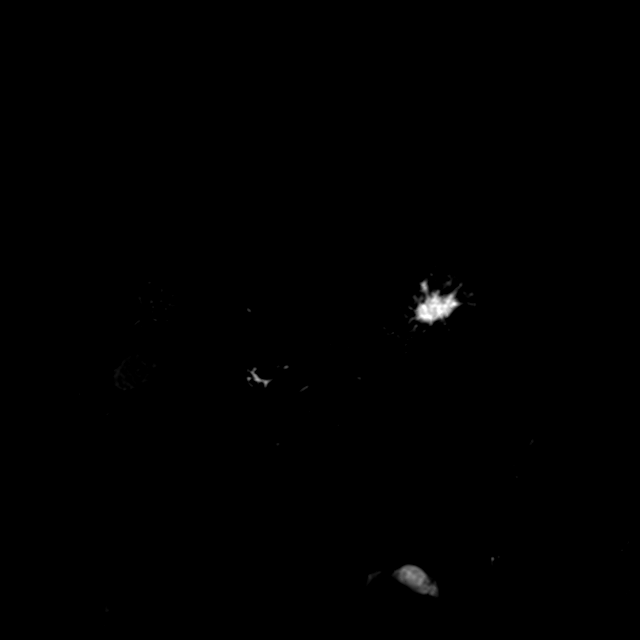
[im 53/80]
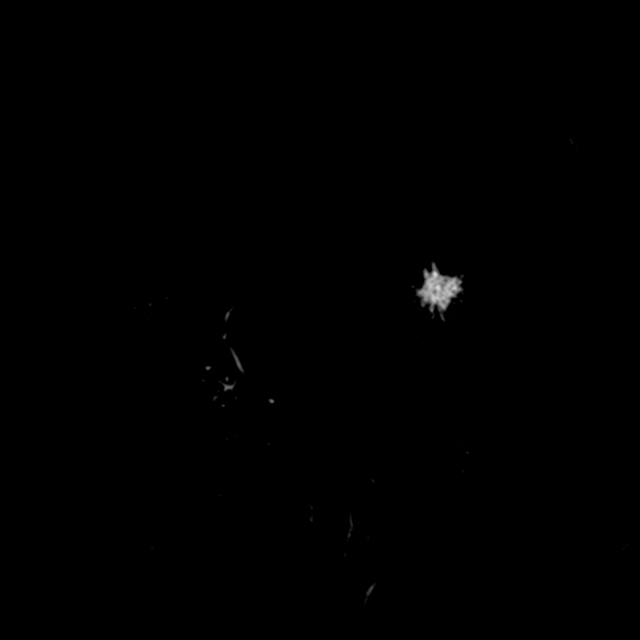
[im 80/80]
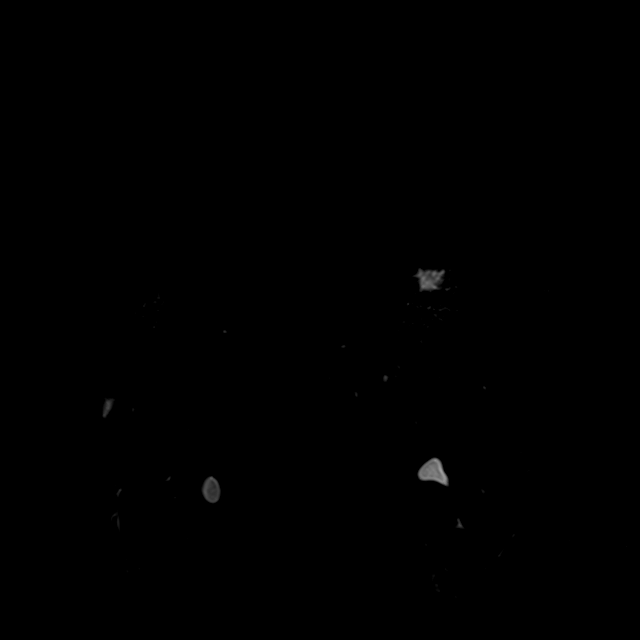

[Series 18: T1 dynamic · axial · 3.0mm · 1.25mm/px · z∈[-196,+89]mm · 4 of 96 slices shown (1 of 6)]
[im 1/96]
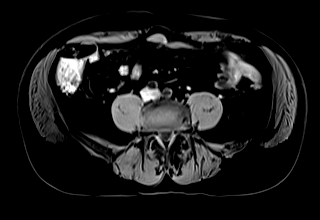
[im 32/96]
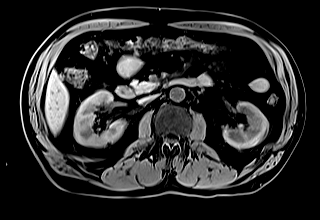
[im 64/96]
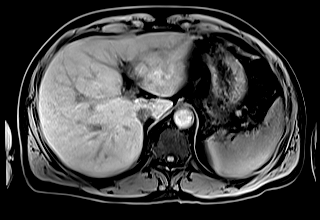
[im 96/96]
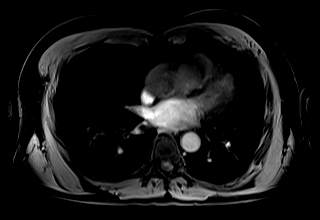

[Series 21: T1 dynamic · axial · 3.0mm · 1.25mm/px · z∈[-120,+117]mm · 4 of 80 slices shown (2 of 6)]
[im 1/80]
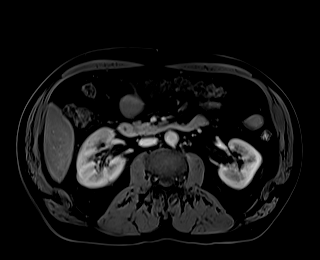
[im 27/80]
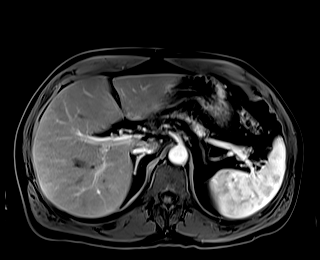
[im 53/80]
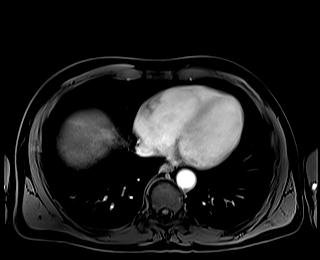
[im 80/80]
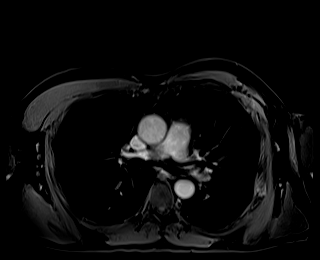

[Series 23: T1 dynamic · axial · 3.0mm · 1.25mm/px · z∈[-120,+117]mm · 4 of 80 slices shown (3 of 6)]
[im 1/80]
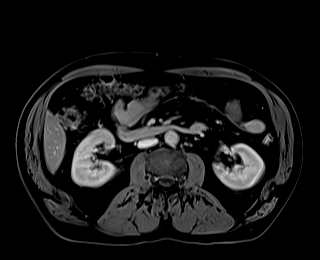
[im 27/80]
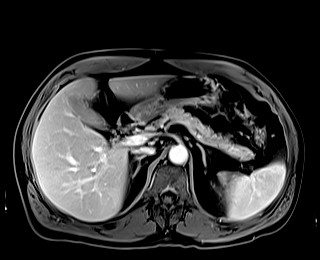
[im 53/80]
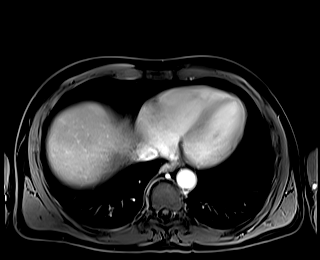
[im 80/80]
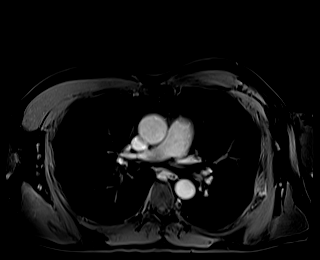

[Series 25: T1 dynamic · axial · 3.0mm · 1.25mm/px · z∈[-120,+117]mm · 4 of 80 slices shown (4 of 6)]
[im 1/80]
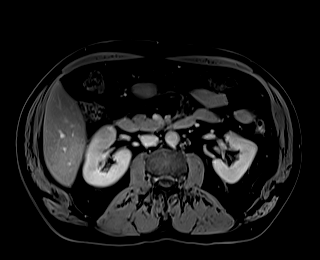
[im 27/80]
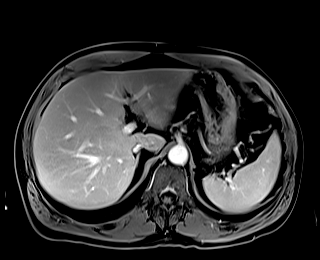
[im 53/80]
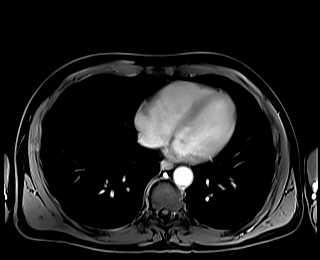
[im 80/80]
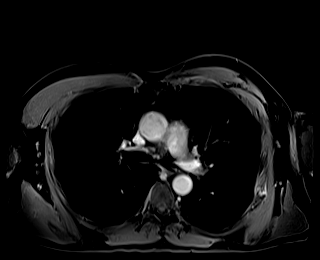

[Series 27: T1 dynamic · coronal · 5.0mm · 1.25mm/px · 3 of 56 slices shown (5 of 6)]
[im 1/56]
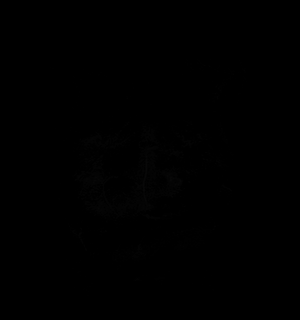
[im 28/56]
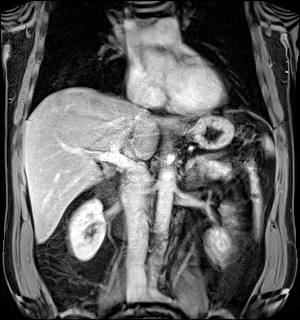
[im 56/56]
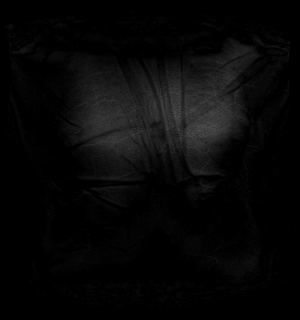

[Series 29: T1 dynamic · axial · 3.0mm · 1.25mm/px · z∈[-120,-42]mm · 2 of 80 slices shown (6 of 6)]
[im 1/80]
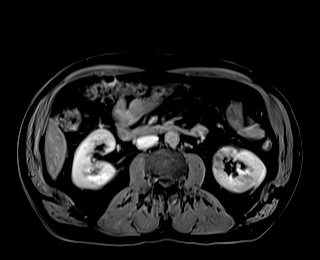
[im 27/80]
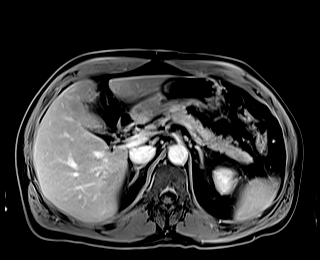

[43 of 48 positions shown; findings below may reference images not displayed]

FINDINGS: Lower chest: No acute abnormality.

Hepatobiliary: Mild loss of signal on out of phase imaging
throughout the hepatic parenchyma consistent with hepatic steatosis.
No suspicious hepatic lesion. Gallbladder is unremarkable. No
biliary ductal dilation.

Pancreas: Normal intrinsic T1 signal throughout the pancreatic
parenchyma. No pancreatic ductal dilation. No cystic or solid
arterially enhancing pancreatic lesions visualized.

Spleen:  Within normal limits in size and appearance.

Adrenals/Urinary Tract: No masses identified. No evidence of
hydronephrosis.

Stomach/Bowel: Visualized portions within the abdomen are
unremarkable.

Vascular/Lymphatic: No pathologically enlarged lymph nodes
identified. No abdominal aortic aneurysm demonstrated.

Other:  None.

Musculoskeletal: No suspicious bone lesions identified.
IMPRESSION: 1. No cystic or arterially enhancing pancreatic mass visualized. No
pancreatic ductal dilation.
2. Mild hepatic steatosis.

## 2020-12-06 IMAGING — CT CT CARDIAC CORONARY ARTERY CALCIUM SCORE
3 series · 13 of 20 positions shown, 15 images · non-contrast
Comparison: None.

CLINICAL DATA: 54-year-old Caucasian male with history of
hyperlipidemia.

EXAM:
CT CARDIAC CORONARY ARTERY CALCIUM SCORE
TECHNIQUE: Non-contrast imaging through the heart was performed using
prospective ECG gating. Image post processing was performed on an
independent workstation, allowing for quantitative analysis of the
heart and coronary arteries. Note that this exam targets the heart
and the chest was not imaged in its entirety.

[Series 2: calcium scoring 2.00 qr36 bestdiast 70% hrt calciu · axial · 0.37mm/px · z∈[+1733,+1789]mm · 3 of 70 slices shown]
[im 14/70  vessel]
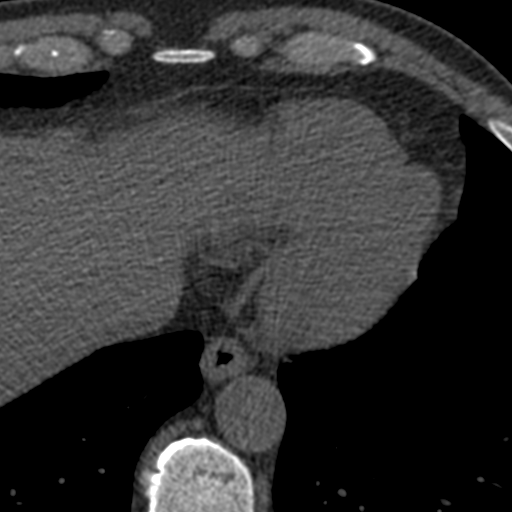
[im 28/70  vessel]
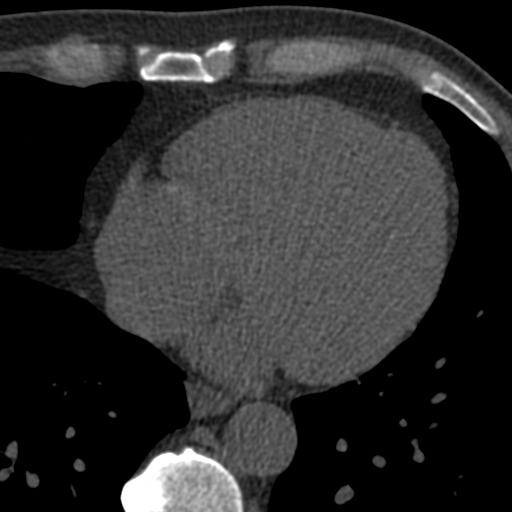
[im 42/70  vessel]
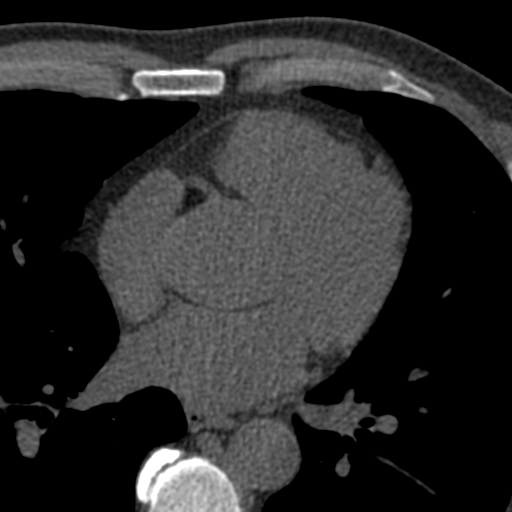

[Series 3: calcium scoring 2.00 br40 bestdiast 70% axial · axial · 0.61mm/px · z∈[+1729,+1821]mm · 5 of 70 slices shown, 7 images]
[im 12/70  vessel]
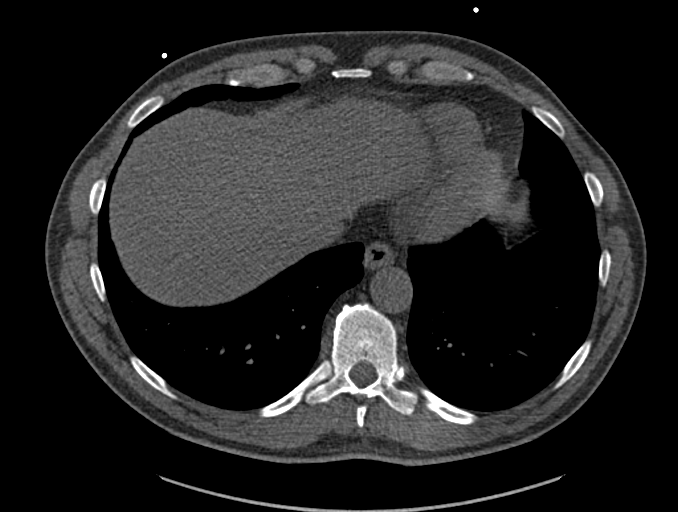
[im 12/70  lung]
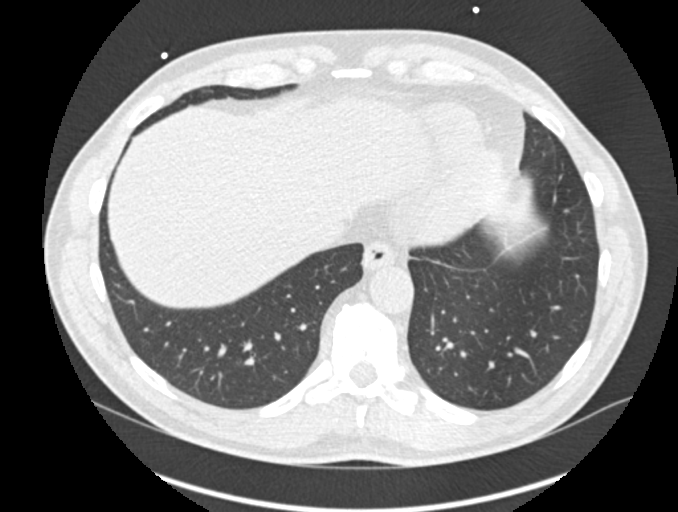
[im 24/70  vessel]
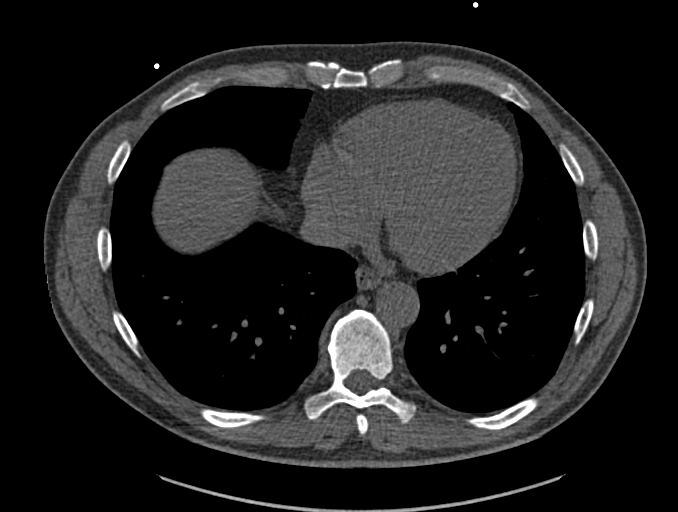
[im 35/70  vessel]
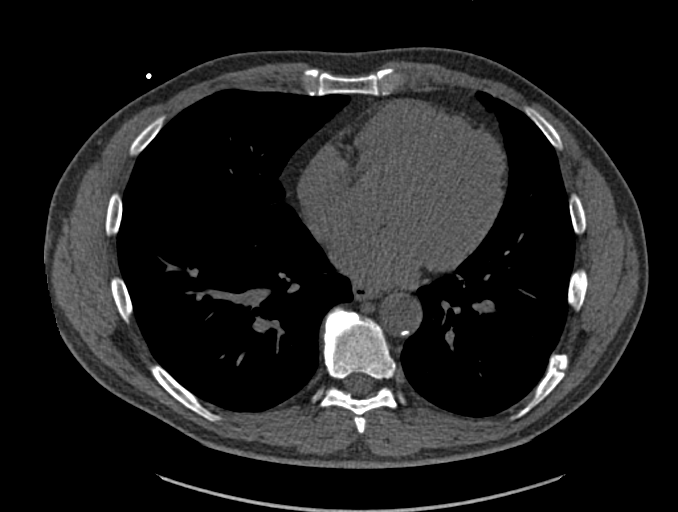
[im 47/70  vessel]
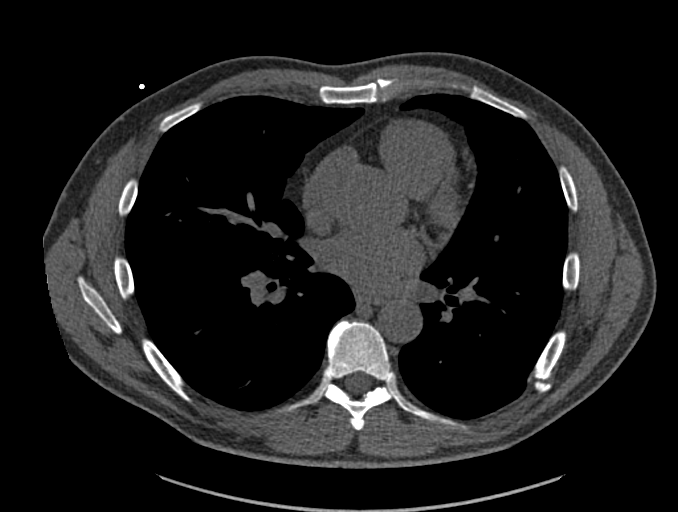
[im 58/70  vessel]
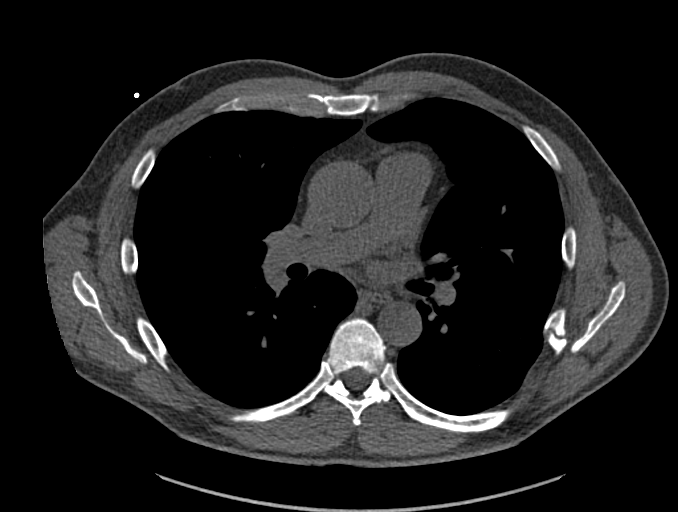
[im 58/70  lung]
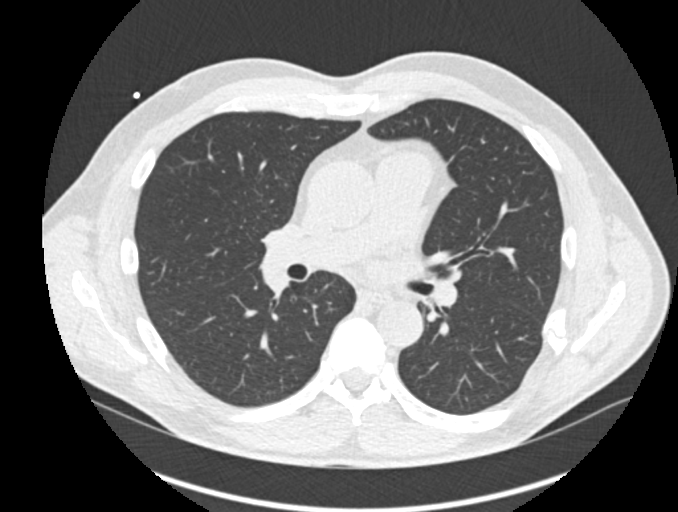

[Series 9: calcium scoring 2.00 br60 bestdiast 70% lungs · axial · 0.61mm/px · z∈[+1729,+1821]mm · 5 of 70 slices shown]
[im 12/70  vessel]
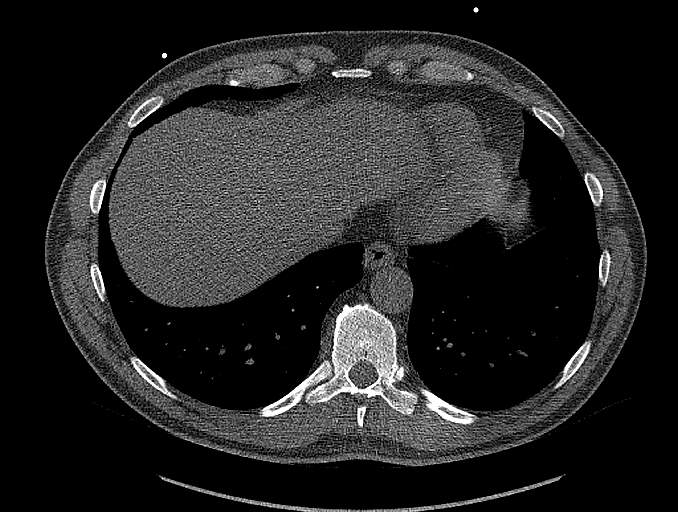
[im 24/70  vessel]
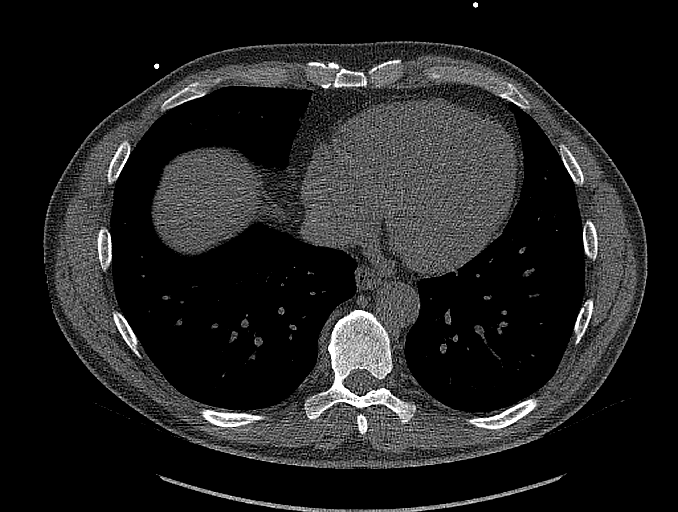
[im 35/70  vessel]
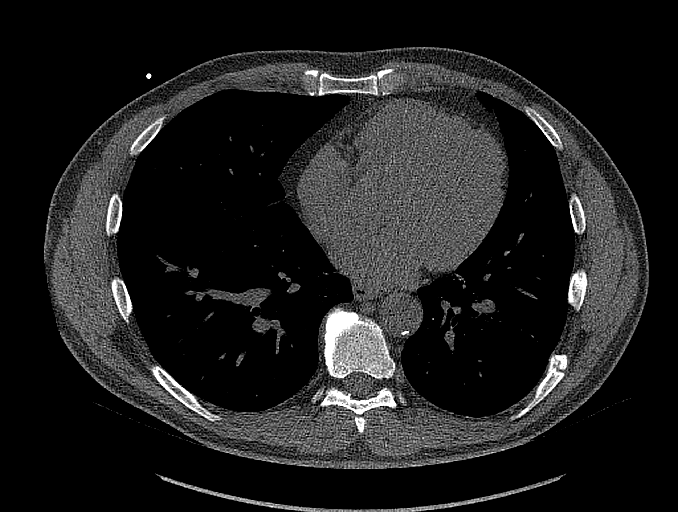
[im 47/70  vessel]
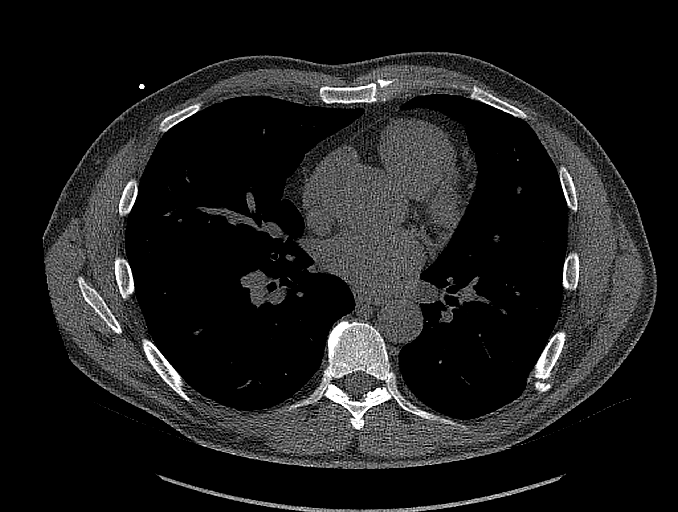
[im 58/70  vessel]
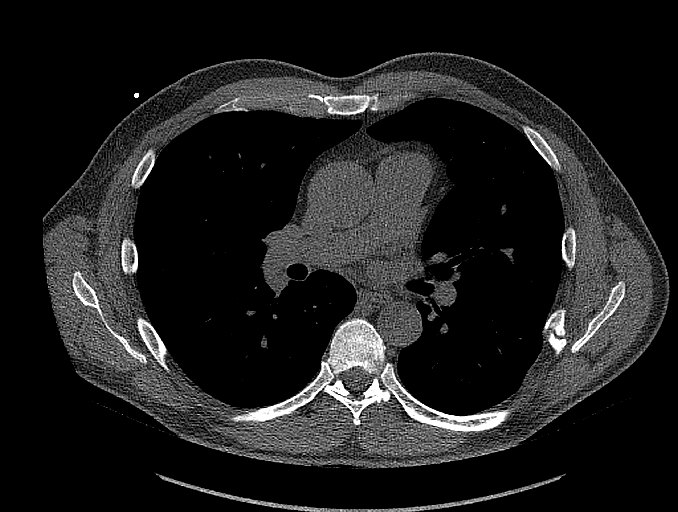

[13 of 20 positions shown; findings below may reference images not displayed]

FINDINGS: CORONARY CALCIUM SCORES:

Left Main: 0

LAD: 103

LCx: 0

RCA: 0

Total Agatston Score: 103

[HOSPITAL] percentile: 82

AORTA MEASUREMENTS:

Ascending Aorta: 40 mm

Descending Aorta: 27 mm

OTHER FINDINGS:

The heart size is within normal limits. No pericardial fluid
identified. The visualized ascending thoracic aorta demonstrates
mild dilatation with estimated maximal caliber of approximately 4 cm
by unenhanced CT. Visualized central pulmonary arteries are normal
in caliber. The visualized mediastinum and hilar regions demonstrate
no lymphadenopathy or masses. Visualized lungs show no evidence of
pulmonary edema, consolidation, pneumothorax, nodule or pleural
fluid. Visualized upper abdomen and bony structures are
unremarkable.
IMPRESSION: 1. Coronary calcium score of 103 is at the 82nd percentile for the
patient's age, sex and race.
2. Mild dilatation of the ascending thoracic aorta measuring up to 4
cm by unenhanced CT. Correlation with echocardiography may be
helpful. Recommend annual imaging followup by CTA or MRA. This
recommendation follows [C5]
ACCF/AHA/AATS/ACR/ASA/SCA/GUERRA/GUERRA/GUERRA/GUERRA Guidelines for the
Diagnosis and Management of Patients with Thoracic Aortic Disease.
Circulation. [C5]; 121: [id number]. Aortic aneurysm NOS ([C5]-[C5])

## 2020-12-06 IMAGING — MR MR 3D RECON AT SCANNER
15 of 17 series · 43 of 48 positions shown · IV contrast (gadavist)
Comparison: MRI [DATE].

CLINICAL DATA: Strong family history of pancreatic neoplasm,
screening

EXAM:
MRI ABDOMEN WITHOUT AND WITH CONTRAST (INCLUDING MRCP)
TECHNIQUE: Multiplanar multisequence MR imaging of the abdomen was performed
both before and after the administration of intravenous contrast.
Heavily T2-weighted images of the biliary and pancreatic ducts were
obtained, and three-dimensional MRCP images were rendered by post
processing.
CONTRAST:  9mL GADAVIST GADOBUTROL 1 MMOL/ML IV SOLN

[Series 3: DWI · axial · 6.0mm · 1.49mm/px · z∈[-200,+88]mm · 3 of 82 slices shown (1 of 2)]
[im 1/82]
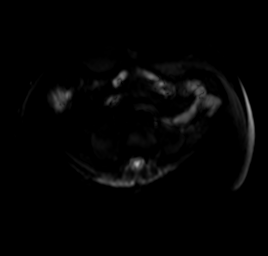
[im 41/82]
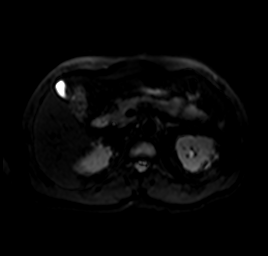
[im 82/82]
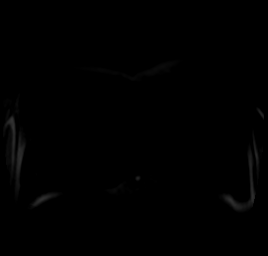

[Series 4: DWI · axial · 6.0mm · 1.49mm/px · 1 of 41 slices shown (2 of 2)]
[im 1/41]
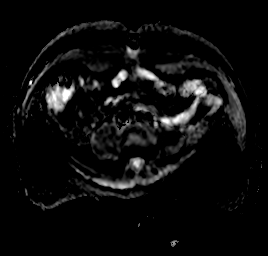

[Series 6: T2 fat-sat · axial · 6.0mm · 1.25mm/px · z∈[-197,+113]mm · 2 of 44 slices shown]
[im 1/44]
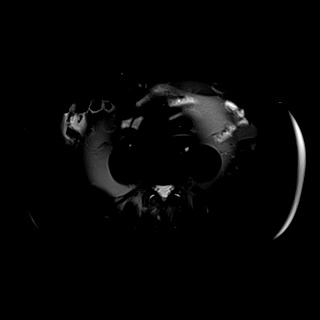
[im 44/44]
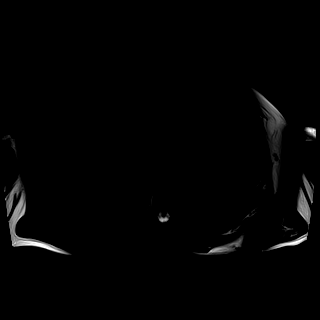

[Series 8: T2 · coronal · 6.0mm · 1.56mm/px · 2 of 38 slices shown (1 of 2)]
[im 1/38]
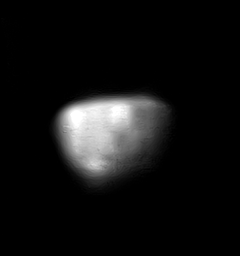
[im 38/38]
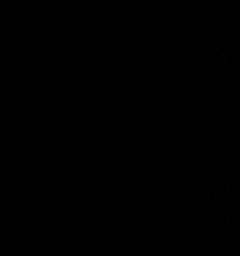

[Series 10: T1 · axial · 3.0mm · 1.25mm/px · z∈[-186,+99]mm · 4 of 96 slices shown (1 of 2)]
[im 1/96]
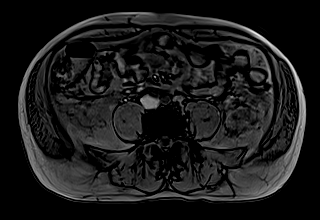
[im 32/96]
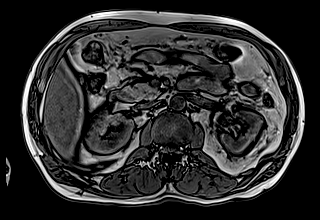
[im 64/96]
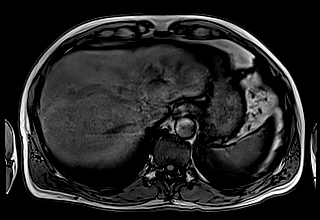
[im 96/96]
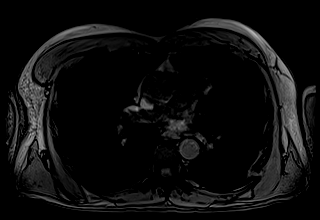

[Series 11: T1 · axial · 3.0mm · 1.25mm/px · z∈[-186,+99]mm · 4 of 96 slices shown (2 of 2)]
[im 1/96]
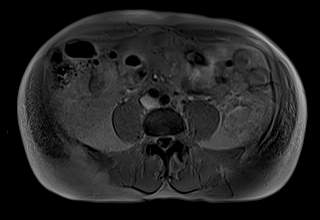
[im 32/96]
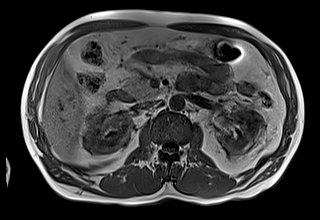
[im 64/96]
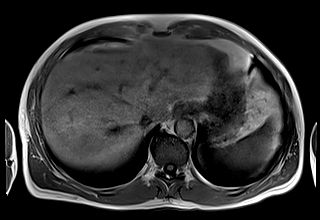
[im 96/96]
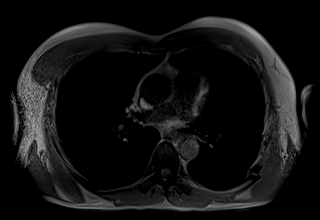

[Series 12: cor obl thk · sagittal · 50.0mm · 0.78mm/px · 1 of 9 slices shown]
[im 1/9]
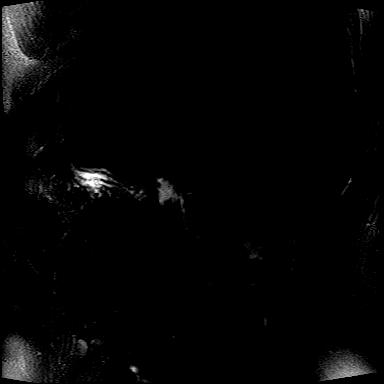

[Series 13: T2 · axial · 6.0mm · 1.56mm/px · 1 of 30 slices shown (2 of 2)]
[im 1/30]
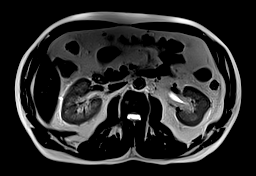

[Series 15: cor_3d_spc_trig · coronal · 1.0mm · 0.59mm/px · 4 of 80 slices shown]
[im 1/80]
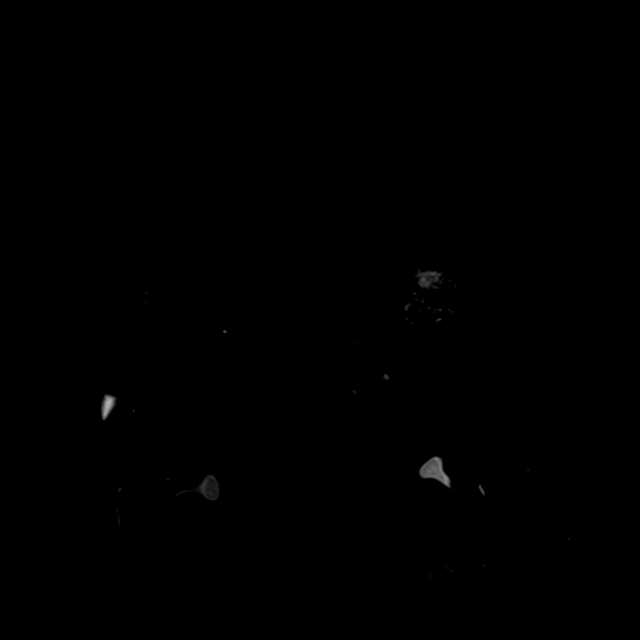
[im 27/80]
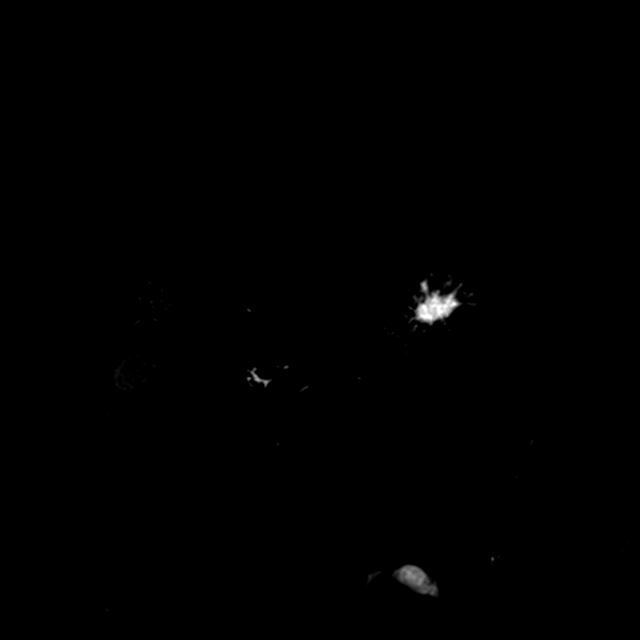
[im 53/80]
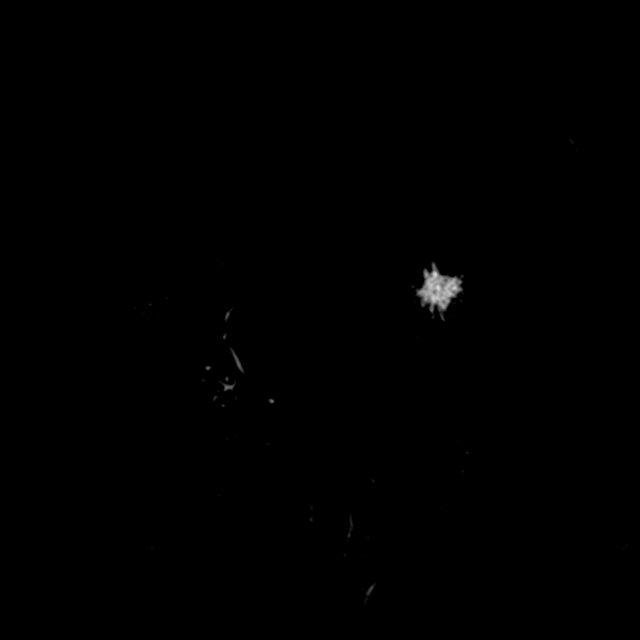
[im 80/80]
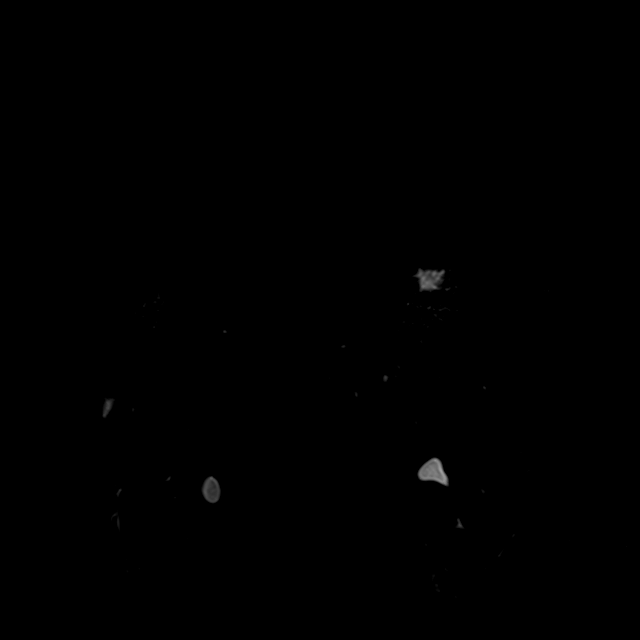

[Series 18: T1 dynamic · axial · 3.0mm · 1.25mm/px · z∈[-196,+89]mm · 4 of 96 slices shown (1 of 6)]
[im 1/96]
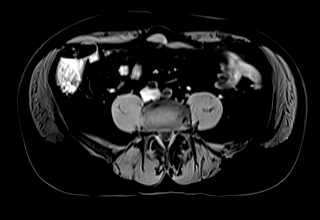
[im 32/96]
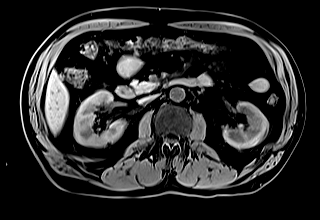
[im 64/96]
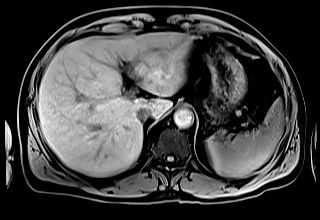
[im 96/96]
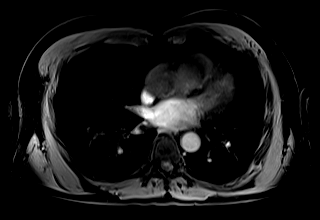

[Series 21: T1 dynamic · axial · 3.0mm · 1.25mm/px · z∈[-120,+117]mm · 4 of 80 slices shown (2 of 6)]
[im 1/80]
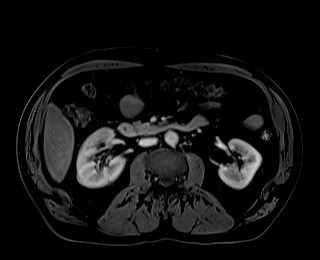
[im 27/80]
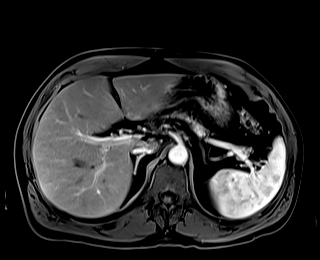
[im 53/80]
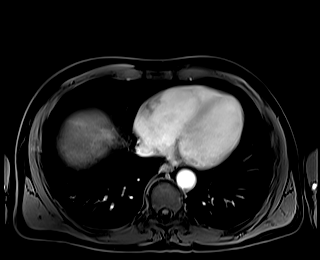
[im 80/80]
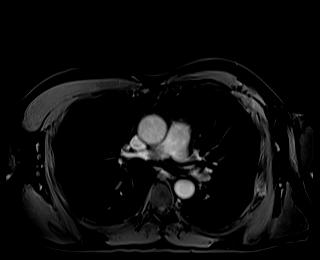

[Series 23: T1 dynamic · axial · 3.0mm · 1.25mm/px · z∈[-120,+117]mm · 4 of 80 slices shown (3 of 6)]
[im 1/80]
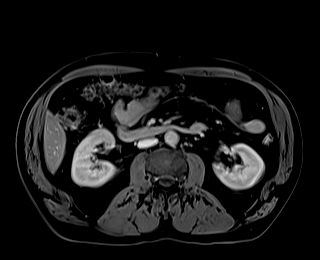
[im 27/80]
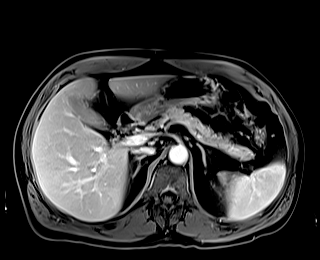
[im 53/80]
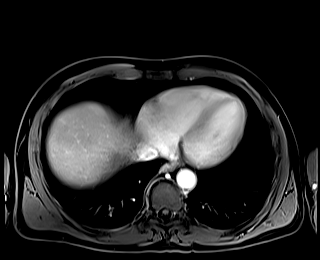
[im 80/80]
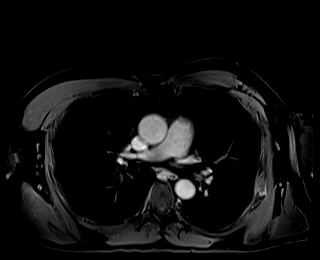

[Series 25: T1 dynamic · axial · 3.0mm · 1.25mm/px · z∈[-120,+117]mm · 4 of 80 slices shown (4 of 6)]
[im 1/80]
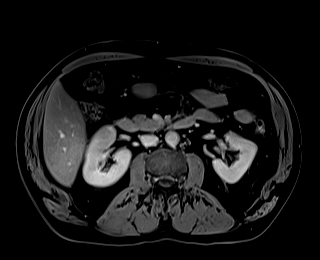
[im 27/80]
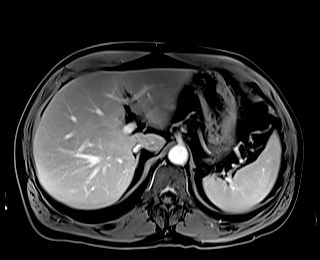
[im 53/80]
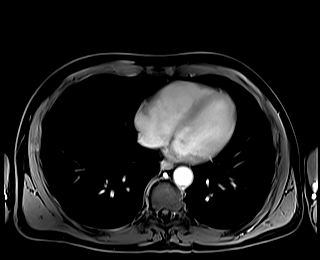
[im 80/80]
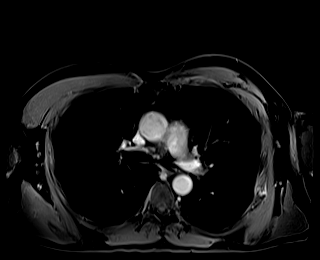

[Series 27: T1 dynamic · coronal · 5.0mm · 1.25mm/px · 3 of 56 slices shown (5 of 6)]
[im 1/56]
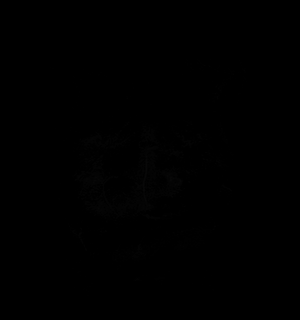
[im 28/56]
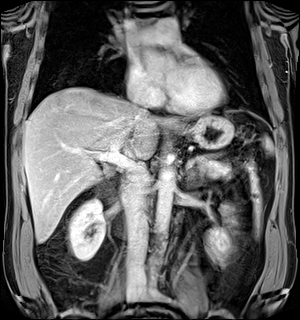
[im 56/56]
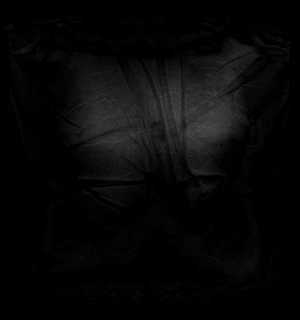

[Series 29: T1 dynamic · axial · 3.0mm · 1.25mm/px · z∈[-120,-42]mm · 2 of 80 slices shown (6 of 6)]
[im 1/80]
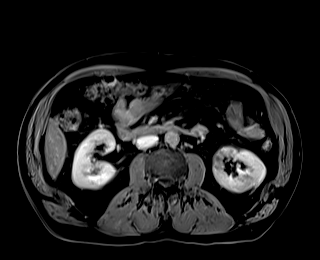
[im 27/80]
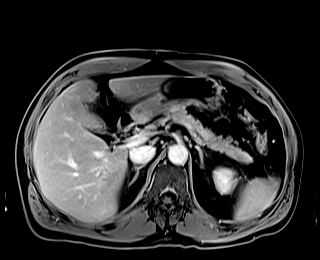

[43 of 48 positions shown; findings below may reference images not displayed]

FINDINGS: Lower chest: No acute abnormality.

Hepatobiliary: Mild loss of signal on out of phase imaging
throughout the hepatic parenchyma consistent with hepatic steatosis.
No suspicious hepatic lesion. Gallbladder is unremarkable. No
biliary ductal dilation.

Pancreas: Normal intrinsic T1 signal throughout the pancreatic
parenchyma. No pancreatic ductal dilation. No cystic or solid
arterially enhancing pancreatic lesions visualized.

Spleen:  Within normal limits in size and appearance.

Adrenals/Urinary Tract: No masses identified. No evidence of
hydronephrosis.

Stomach/Bowel: Visualized portions within the abdomen are
unremarkable.

Vascular/Lymphatic: No pathologically enlarged lymph nodes
identified. No abdominal aortic aneurysm demonstrated.

Other:  None.

Musculoskeletal: No suspicious bone lesions identified.
IMPRESSION: 1. No cystic or arterially enhancing pancreatic mass visualized. No
pancreatic ductal dilation.
2. Mild hepatic steatosis.

## 2020-12-06 MED ORDER — GADOBUTROL 1 MMOL/ML IV SOLN
9.0000 mL | Freq: Once | INTRAVENOUS | Status: AC | PRN
  Administered 2020-12-06: 9 mL via INTRAVENOUS

## 2020-12-14 ENCOUNTER — Telehealth: Payer: Self-pay | Admitting: General Surgery

## 2020-12-14 DIAGNOSIS — R1310 Dysphagia, unspecified: Secondary | ICD-10-CM

## 2020-12-14 DIAGNOSIS — Z800 Family history of malignant neoplasm of digestive organs: Secondary | ICD-10-CM

## 2020-12-14 NOTE — Telephone Encounter (Signed)
-----   Message from [place], DO sent at 12/13/2020  8:43 AM EDT ----- Good news, the MRI/MRCP demonstrates a normal-appearing pancreas and normal pancreatic duct.  There was evidence of fatty liver infiltration (hepatic steatosis) but otherwise normal bile ducts.  Per Pancreatic Cancer screening protocol, plan for repeat EUS and hemoglobin A1c in 6 months.

## 2020-12-14 NOTE — Telephone Encounter (Signed)
Left a detailed message on the patients voicemail that his MRCP was WNL with hepatic steatosis. We will have the patient return to have an EUS and HGB A1C. Placed a recall for EUS and sent a message to [facility]

## 2020-12-14 NOTE — Telephone Encounter (Signed)
noted 

## 2020-12-15 ENCOUNTER — Other Ambulatory Visit: Payer: Self-pay

## 2020-12-15 ENCOUNTER — Encounter: Payer: Self-pay | Admitting: Gastroenterology

## 2020-12-15 ENCOUNTER — Ambulatory Visit (AMBULATORY_SURGERY_CENTER): Payer: 59 | Admitting: Gastroenterology

## 2020-12-15 VITALS — BP 137/90 | HR 51 | Temp 97.1°F | Resp 17 | Ht 74.0 in | Wt 199.0 lb

## 2020-12-15 DIAGNOSIS — R195 Other fecal abnormalities: Secondary | ICD-10-CM

## 2020-12-15 DIAGNOSIS — K635 Polyp of colon: Secondary | ICD-10-CM

## 2020-12-15 DIAGNOSIS — K640 First degree hemorrhoids: Secondary | ICD-10-CM

## 2020-12-15 DIAGNOSIS — K5730 Diverticulosis of large intestine without perforation or abscess without bleeding: Secondary | ICD-10-CM

## 2020-12-15 DIAGNOSIS — D125 Benign neoplasm of sigmoid colon: Secondary | ICD-10-CM

## 2020-12-15 MED ORDER — SODIUM CHLORIDE 0.9 % IV SOLN: 500.0000 mL | Freq: Once | INTRAVENOUS | Status: DC

## 2020-12-15 NOTE — Progress Notes (Signed)
PT taken to PACU. Monitors in place. VSS. Report given to RN. 

## 2020-12-15 NOTE — Op Note (Signed)
[facility] Patient Name: Francisco Phillips Procedure Date: 12/15/2020 8:37 AM MRN: [id number] Endoscopist: Gerrit Heck , MD Age: 55 Referring MD:  Date of Birth: [birth year] Gender: Male Account #: [id number] Procedure:                Colonoscopy Indications:              Heme positive stool Medicines:                Monitored Anesthesia Care Procedure:                Pre-Anesthesia Assessment:                           - Prior to the procedure, a History and Physical                            was performed, and patient medications and                            allergies were reviewed. The patient's tolerance of                            previous anesthesia was also reviewed. The risks                            and benefits of the procedure and the sedation                            options and risks were discussed with the patient.                            All questions were answered, and informed consent                            was obtained. Prior Anticoagulants: The patient has                            taken no previous anticoagulant or antiplatelet                            agents. ASA Grade Assessment: II - A patient with                            mild systemic disease. After reviewing the risks                            and benefits, the patient was deemed in                            satisfactory condition to undergo the procedure.                           After obtaining informed consent, the colonoscope  was passed under direct vision. Throughout the                            procedure, the patient's blood pressure, pulse, and                            oxygen saturations were monitored continuously. The                            Olympus CF-HQ190L [id number]) Colonoscope was                            introduced through the anus and advanced to the the                            cecum, identified by appendiceal orifice and                             ileocecal valve. The colonoscopy was performed                            without difficulty. The patient tolerated the                            procedure well. The quality of the bowel                            preparation was good. The ileocecal valve,                            appendiceal orifice, and rectum were photographed. Scope In: 8:49:38 AM Scope Out: 9:14:04 AM Scope Withdrawal Time: 0 hours 17 minutes 1 second  Total Procedure Duration: 0 hours 24 minutes 26 seconds  Findings:                 The perianal and digital rectal examinations were                            normal.                           A 3 mm polyp was found in the sigmoid colon. The                            polyp was sessile. The polyp was removed with a                            cold snare. Resection and retrieval were complete.                            Estimated blood loss was minimal.                           A single small-mouthed diverticulum was found in  the ascending colon.                           Non-bleeding internal hemorrhoids were found during                            retroflexion. The hemorrhoids were small and Grade                            I (internal hemorrhoids that do not prolapse).                           The exam was otherwise normal throughout the                            remainder of the colon. Complications:            No immediate complications. Estimated Blood Loss:     Estimated blood loss was minimal. Impression:               - One 3 mm polyp in the sigmoid colon, removed with                            a cold snare. Resected and retrieved.                           - Diverticulosis in the ascending colon.                           - Non-bleeding internal hemorrhoids. Recommendation:           - Patient has a contact number available for                            emergencies. The signs and symptoms of  potential                            delayed complications were discussed with the                            patient. Return to normal activities tomorrow.                            Written discharge instructions were provided to the                            patient.                           - Resume previous diet.                           - Continue present medications.                           - Await pathology results.                           -  Repeat colonoscopy in 5-10 years for surveillance                            based on pathology results. Gerrit Heck, MD 12/15/2020 9:18:12 AM

## 2020-12-15 NOTE — Progress Notes (Signed)
Called to room to assist during endoscopic procedure.  Patient ID and intended procedure confirmed with present staff. Received instructions for my participation in the procedure from the performing physician.  

## 2020-12-15 NOTE — Patient Instructions (Signed)
Await pathology  Please read over handouts about polyps, hemorrhoids and diverticulosis  Continue your normal medications  YOU HAD AN ENDOSCOPIC PROCEDURE TODAY AT [facility]:   Refer to the procedure report that was given to you for any specific questions about what was found during the examination.  If the procedure report does not answer your questions, please call your gastroenterologist to clarify.  If you requested that your care partner not be given the details of your procedure findings, then the procedure report has been included in a sealed envelope for you to review at your convenience later.  YOU SHOULD EXPECT: Some feelings of bloating in the abdomen. Passage of more gas than usual.  Walking can help get rid of the air that was put into your GI tract during the procedure and reduce the bloating. If you had a lower endoscopy (such as a colonoscopy or flexible sigmoidoscopy) you may notice spotting of blood in your stool or on the toilet paper. If you underwent a bowel prep for your procedure, you may not have a normal bowel movement for a few days.  Please Note:  You might notice some irritation and congestion in your nose or some drainage.  This is from the oxygen used during your procedure.  There is no need for concern and it should clear up in a day or so.  SYMPTOMS TO REPORT IMMEDIATELY:  Following lower endoscopy (colonoscopy or flexible sigmoidoscopy):  Excessive amounts of blood in the stool  Significant tenderness or worsening of abdominal pains  Swelling of the abdomen that is new, acute  Fever of 100F or higher  Following upper endoscopy (EGD)  Vomiting of blood or coffee ground material  New chest pain or pain under the shoulder blades  Painful or persistently difficult swallowing  New shortness of breath  Fever of 100F or higher  Black, tarry-looking stools  For urgent or emergent issues, a gastroenterologist can be reached at any hour by  calling [phone]. Do not use MyChart messaging for urgent concerns.    DIET:  We do recommend a small meal at first, but then you may proceed to your regular diet.  Drink plenty of fluids but you should avoid alcoholic beverages for 24 hours.  ACTIVITY:  You should plan to take it easy for the rest of today and you should NOT DRIVE or use heavy machinery until tomorrow (because of the sedation medicines used during the test).    FOLLOW UP: Our staff will call the number listed on your records 48-72 hours following your procedure to check on you and address any questions or concerns that you may have regarding the information given to you following your procedure. If we do not reach you, we will leave a message.  We will attempt to reach you two times.  During this call, we will ask if you have developed any symptoms of COVID 19. If you develop any symptoms (ie: fever, flu-like symptoms, shortness of breath, cough etc.) before then, please call [phone].  If you test positive for Covid 19 in the 2 weeks post procedure, please call and report this information to [place].    If any biopsies were taken you will be contacted by phone or by letter within the next 1-3 weeks.  Please call us at [phone] if you have not heard about the biopsies in 3 weeks.    SIGNATURES/CONFIDENTIALITY: You and/or your care partner have signed paperwork which will be entered into your  electronic medical record.  These signatures attest to the fact that that the information above on your After Visit Summary has been reviewed and is understood.  Full responsibility of the confidentiality of this discharge information lies with you and/or your care-partner.

## 2020-12-15 NOTE — Progress Notes (Signed)
VS taken by C.W. 

## 2020-12-17 ENCOUNTER — Telehealth: Payer: Self-pay | Admitting: *Deleted

## 2020-12-17 NOTE — Telephone Encounter (Signed)
  Follow up Call-  Call back number 12/15/2020 12/09/2019 09/16/2019  Post procedure Call Back phone  # [phone]  Permission to leave phone message Yes Yes Yes  Some recent data might be hidden     Patient questions:  Do you have a fever, pain , or abdominal swelling? No. Pain Score  0 *  Have you tolerated food without any problems? Yes.    Have you been able to return to your normal activities? Yes.    Do you have any questions about your discharge instructions: Diet   No. Medications  No. Follow up visit  No.  Do you have questions or concerns about your Care? No.  Actions: * If pain score is 4 or above: No action needed, pain <4.  Have you developed a fever since your procedure? no  2.   Have you had an respiratory symptoms (SOB or cough) since your procedure? no  3.   Have you tested positive for COVID 19 since your procedure no  4.   Have you had any family members/close contacts diagnosed with the COVID 19 since your procedure?  no   If yes to any of these questions please route to Joylene John, RN and Joella Prince, RN

## 2020-12-22 ENCOUNTER — Encounter: Payer: Self-pay | Admitting: Gastroenterology

## 2021-03-28 ENCOUNTER — Other Ambulatory Visit: Payer: Self-pay | Admitting: Gastroenterology

## 2021-03-28 DIAGNOSIS — K2100 Gastro-esophageal reflux disease with esophagitis, without bleeding: Secondary | ICD-10-CM

## 2021-03-28 DIAGNOSIS — K449 Diaphragmatic hernia without obstruction or gangrene: Secondary | ICD-10-CM

## 2021-03-28 DIAGNOSIS — R1310 Dysphagia, unspecified: Secondary | ICD-10-CM

## 2021-03-28 DIAGNOSIS — K209 Esophagitis, unspecified without bleeding: Secondary | ICD-10-CM

## 2021-03-28 DIAGNOSIS — K2970 Gastritis, unspecified, without bleeding: Secondary | ICD-10-CM

## 2021-04-01 ENCOUNTER — Telehealth: Payer: Self-pay | Admitting: Internal Medicine

## 2021-04-01 ENCOUNTER — Ambulatory Visit (INDEPENDENT_AMBULATORY_CARE_PROVIDER_SITE_OTHER): Payer: 59 | Admitting: Internal Medicine

## 2021-04-01 ENCOUNTER — Encounter: Payer: Self-pay | Admitting: Internal Medicine

## 2021-04-01 ENCOUNTER — Other Ambulatory Visit: Payer: Self-pay

## 2021-04-01 VITALS — BP 124/80 | Ht 74.0 in | Wt 199.2 lb

## 2021-04-01 DIAGNOSIS — E7849 Other hyperlipidemia: Secondary | ICD-10-CM

## 2021-04-01 DIAGNOSIS — I77810 Thoracic aortic ectasia: Secondary | ICD-10-CM

## 2021-04-01 DIAGNOSIS — R931 Abnormal findings on diagnostic imaging of heart and coronary circulation: Secondary | ICD-10-CM | POA: Diagnosis not present

## 2021-04-01 NOTE — Patient Instructions (Addendum)
Medication Instructions:  NO CHANGES today   *If you need a refill on your cardiac medications before your next appointment, please call your pharmacy*   Lab Work: NMR lipoprofile & LP(a) today   If you have labs (blood work) drawn today and your tests are completely normal, you will receive your results only by: [place] (if you have MyChart) OR A paper copy in the mail If you have any lab test that is abnormal or we need to change your treatment, we will call you to review the results.   Testing/Procedures: Genetic Test - ASCVD/dyslipidemia panel  CT angio chest/aorta due June 2023   Follow-Up: At [facility], you and your health needs are our priority.  As part of our continuing mission to provide you with exceptional heart care, we have created designated Provider Care Teams.  These Care Teams include your primary Cardiologist (physician) and Advanced Practice Providers (APPs -  Physician Assistants and Nurse Practitioners) who all work together to provide you with the care you need, when you need it.  We recommend signing up for the patient portal called "MyChart".  Sign up information is provided on this After Visit Summary.  MyChart is used to connect with patients for Virtual Visits (Telemedicine).  Patients are able to view lab/test results, encounter notes, upcoming appointments, etc.  Non-urgent messages can be sent to your provider as well.   To learn more about what you can do with MyChart, go to [email or web address].    Your next appointment:   4 month(s)  The format for your next appointment:   In Person  Provider:   Dr. Lyman Bishop

## 2021-04-01 NOTE — Telephone Encounter (Signed)
Genetic test for ASCVD/dyslipidemia ordered ([facility]) Cheek swab completed in office Specimen and necessary paperwork mailed.

## 2021-04-01 NOTE — Progress Notes (Signed)
OFFICE CONSULT NOTE  Chief Complaint:  Abnormal calcium score  Primary Care Physician: Deland Pretty, MD  HPI:  Francisco Phillips is a 55 y.o. male who is being seen today for the evaluation of abnormal calcium score at the request of Deland Pretty, MD. 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  Calcium was noted primarily in the LAD which was 82nd percentile for age and sex matched control.  In addition the ascending thoracic aorta was dilated at 4.0 cm.  Patient is asymptomatic.  He has a longstanding history of physical activity and exercise.  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  He has been on rosuvastatin 40 mg daily for years and recently had repeat lipid testing.  His total cholesterol was 234, triglycerides 140, HDL 63 and LDL 143.  Based on this ezetimibe 10 mg daily was added.  He has not had repeat lipids since then.  With his high LDL cholesterol on high potency statin, is very likely has a familial hyperlipidemia.  He does note that he has family members with high cholesterol including his maternal grandmother had high cholesterol.  His mother died in her 7s of pancreatic cancer in her father as well.  He has already had genetic testing for cancers.  He reports a generally healthy diet and exercises fairly regularly.  PMHx:  Past Medical History:  Diagnosis Date   Allergy    seasonal   Cancer ([place])    basal cell carcinoma face/right chest/left shoulder   Family history of breast cancer in male    Family history of cystic fibrosis    Family history of pancreatic cancer    GERD (gastroesophageal reflux disease)    Hyperlipidemia     Past Surgical History:  Procedure Laterality Date   BIOPSY  03/08/2020   Procedure: BIOPSY;  Surgeon:  Mansouraty, Telford Nab., MD;  Location: [facility] ENDOSCOPY;  Service: Gastroenterology;;   COLONOSCOPY     With [facility]    ESOPHAGOGASTRODUODENOSCOPY     Dilation Earle Gell with [facility] long time ago   ESOPHAGOGASTRODUODENOSCOPY N/A 03/08/2020   Procedure: ESOPHAGOGASTRODUODENOSCOPY (EGD);  Surgeon: Irving Copas., MD;  Location: [place];  Service: Gastroenterology;  Laterality: N/A;   EUS N/A 03/08/2020   Procedure: UPPER ENDOSCOPIC ULTRASOUND (EUS) RADIAL;  Surgeon: Irving Copas., MD;  Location: [place];  Service: Gastroenterology;  Laterality: N/A;   TONSILLECTOMY     UPPER GASTROINTESTINAL ENDOSCOPY  08/2019    FAMHx:  Family History  Problem Relation Age of Onset   Pancreatic cancer Mother 61   Breast cancer Father 26   Pancreatic cancer Maternal Grandmother 13   Pancreatic cancer Maternal Uncle 4   Dementia Paternal Grandfather    Colon cancer Neg Hx    Esophageal cancer Neg Hx    Rectal cancer Neg Hx    Stomach cancer Neg Hx    Colon polyps Neg Hx     SOCHx:   reports that he has never smoked. He has never used smokeless tobacco. He reports current alcohol use of about 21.0 standard drinks per week. He reports that he does not use drugs.  ALLERGIES:  Allergies  Allergen Reactions   Bee Venom Anaphylaxis, Hives and Rash   Kiwi Extract Anaphylaxis   Other Anaphylaxis,  Hives and Rash    Kiwi,walnuts,spinach,scallops,poppey seeds   Penicillins     Said he can't do it and would prefer another medication  Childhood   Lamictal [Lamotrigine] Rash    ROS: Pertinent items noted in HPI and remainder of comprehensive ROS otherwise negative.  HOME MEDS: Current Outpatient Medications on File Prior to Visit  Medication Sig Dispense Refill   Acetylcysteine (NAC 600 PO) Take 600 mg by mouth daily.     acidophilus (RISAQUAD) CAPS capsule Take 1 capsule by mouth daily.     albuterol (VENTOLIN HFA) 108 (90 Base)  MCG/ACT inhaler 2 puffs as needed     aspirin EC 81 MG tablet Take 81 mg by mouth daily. Swallow whole.     cetirizine (ZYRTEC) 10 MG tablet Take 10 mg by mouth daily.     EPINEPHrine (EPIPEN 2-PAK IJ) Inject 1 each as directed as needed (Allergic reaction). For reaction      ezetimibe (ZETIA) 10 MG tablet Take 10 mg by mouth daily.     Lysine 500 MG TABS Take 500 mg by mouth daily.      meloxicam (MOBIC) 15 MG tablet Take 15 mg by mouth daily.     MILK THISTLE PO Take 1 tablet by mouth daily.     Omega-3 Fatty Acids (FISH OIL) 1200 MG CAPS Take 1,200 mg by mouth daily.      OVER THE COUNTER MEDICATION Take 1 tablet by mouth daily. Tongkat Ali     pantoprazole (PROTONIX) 40 MG tablet TAKE 1 TABLET BY MOUTH TWO TIMES A DAY BEFORE A MEAL 60 tablet 1   rosuvastatin (CRESTOR) 40 MG tablet Take 40 mg by mouth daily.     tadalafil (CIALIS) 20 MG tablet Take 20 mg by mouth daily as needed for erectile dysfunction.     YOHIMBINE HCL PO Take 5 mg by mouth daily.     No current facility-administered medications on file prior to visit.    LABS/IMAGING: No results found for this or any previous visit (from the past 48 hour(s)). No results found.  LIPID PANEL: No results found for: CHOL, TRIG, HDL, CHOLHDL, VLDL, LDLCALC, LDLDIRECT  WEIGHTS: Wt Readings from Last 3 Encounters:  04/01/21 199 lb 3.2 oz (90.4 kg)  12/15/20 199 lb (90.3 kg)  12/01/20 199 lb 4 oz (90.4 kg)    VITALS: BP 124/80 (BP Location: Left Arm, Patient Position: Sitting, Cuff Size: Large)   Ht 6\' 2"  (1.88 m)   Wt 199 lb 3.2 oz (90.4 kg)   SpO2 94%   BMI 25.58 kg/m   EXAM: General appearance: alert and no distress Neck: no carotid bruit, no JVD, and thyroid not enlarged, symmetric, no tenderness/mass/nodules Lungs: clear to auscultation bilaterally Heart: regular rate and rhythm, S1, S2 normal, no murmur, click, rub or gallop Abdomen: soft, non-tender; bowel sounds normal; no masses,  no organomegaly Extremities:  extremities normal, atraumatic, no cyanosis or edema Pulses: 2+ and symmetric Skin: Skin color, texture, turgor normal. No rashes or lesions Neurologic: Grossly normal Psych: Pleasant  EKG: Normal sinus rhythm at 74- personally reviewed  ASSESSMENT: Probable familial hyperlipidemia Family history of high cholesterol Abnormal CAC score 103, 82nd percentile (11/2020) Dilated ascending aorta to 4.0 cm (11/2020)  PLAN: 1.   Mr. Janney has a probable familial hyperlipidemia.  He has been on a high potency statin for years but his LDL cholesterol is only 143, with back calculation that would put his cholesterol to well above 200 for the LDL alone  and likely suggestive of a familial hyperlipidemia.  I would like to proceed with genetic testing today and he is agreeable to that.  Additionally, he does have evidence of ASCVD with an abnormal calcium score 103, representing age advanced coronary disease at the 82nd percentile.  He was already on high potency statin and ezetimibe has been added.  As he has been on the ezetimibe for more than 3 months we will repeat a lipid NMR and LP(a) today.  It is likely he will require more therapy, probably a PCSK9 inhibitor to reach a target LDL less than 70.  We will continue annual screenings of his dilated thoracic aorta with annual CTA's.  Stress testing is not indicated due to the fact that he is asymptomatic and exercises regularly.  Plan follow-up with me in about 3 to 4 months.  Thanks again for the kind referral.  Pixie Casino, MD, FACC, [place] Director of the [facility] Lipid Disorders &  Cardiovascular Risk Reduction Clinic Diplomate of the American Board of Clinical Lipidology Attending Cardiologist  Direct Dial: [phone]  Fax: [phone]  Website:  [email or web address] 04/01/2021, 8:52 AM

## 2021-04-03 LAB — NMR, LIPOPROFILE
Cholesterol, Total: 165 mg/dL (ref 100–199)
HDL Particle Number: 48.7 umol/L (ref >=30.5)
HDL-C: 71 mg/dL (ref >39)
LDL Particle Number: 1098 nmol/L — ABNORMAL HIGH (ref <1000)
LDL Size: 21.2 nm (ref >20.5)
LDL-C (NIH Calc): 80 mg/dL (ref 0–99)
LP-IR Score: 39 (ref <=45)
Small LDL Particle Number: 583 nmol/L — ABNORMAL HIGH (ref <=527)
Triglycerides: 75 mg/dL (ref 0–149)

## 2021-04-03 LAB — LIPOPROTEIN A (LPA): Lipoprotein (a): 125.2 nmol/L — ABNORMAL HIGH (ref <75.0)

## 2021-04-06 ENCOUNTER — Other Ambulatory Visit: Payer: Self-pay | Admitting: *Deleted

## 2021-04-06 ENCOUNTER — Encounter: Payer: Self-pay | Admitting: Internal Medicine

## 2021-04-06 DIAGNOSIS — E7849 Other hyperlipidemia: Secondary | ICD-10-CM

## 2021-05-06 ENCOUNTER — Encounter: Payer: Self-pay | Admitting: *Deleted

## 2021-06-05 ENCOUNTER — Other Ambulatory Visit: Payer: Self-pay | Admitting: Gastroenterology

## 2021-06-05 DIAGNOSIS — K209 Esophagitis, unspecified without bleeding: Secondary | ICD-10-CM

## 2021-06-05 DIAGNOSIS — R1310 Dysphagia, unspecified: Secondary | ICD-10-CM

## 2021-06-05 DIAGNOSIS — K2970 Gastritis, unspecified, without bleeding: Secondary | ICD-10-CM

## 2021-06-05 DIAGNOSIS — K2100 Gastro-esophageal reflux disease with esophagitis, without bleeding: Secondary | ICD-10-CM

## 2021-06-05 DIAGNOSIS — K449 Diaphragmatic hernia without obstruction or gangrene: Secondary | ICD-10-CM

## 2021-06-09 ENCOUNTER — Other Ambulatory Visit: Payer: Self-pay | Admitting: Gastroenterology

## 2021-06-09 DIAGNOSIS — K209 Esophagitis, unspecified without bleeding: Secondary | ICD-10-CM

## 2021-06-09 DIAGNOSIS — R1310 Dysphagia, unspecified: Secondary | ICD-10-CM

## 2021-06-09 DIAGNOSIS — K449 Diaphragmatic hernia without obstruction or gangrene: Secondary | ICD-10-CM

## 2021-06-09 DIAGNOSIS — K2970 Gastritis, unspecified, without bleeding: Secondary | ICD-10-CM

## 2021-06-09 DIAGNOSIS — K2100 Gastro-esophageal reflux disease with esophagitis, without bleeding: Secondary | ICD-10-CM

## 2021-07-29 ENCOUNTER — Ambulatory Visit: Payer: 59 | Admitting: Internal Medicine

## 2021-09-06 ENCOUNTER — Encounter: Payer: Self-pay | Admitting: Internal Medicine

## 2021-09-06 ENCOUNTER — Telehealth (INDEPENDENT_AMBULATORY_CARE_PROVIDER_SITE_OTHER): Payer: Self-pay | Admitting: Internal Medicine

## 2021-09-06 ENCOUNTER — Other Ambulatory Visit: Payer: Self-pay

## 2021-09-06 VITALS — Ht 74.0 in | Wt 190.0 lb

## 2021-09-06 DIAGNOSIS — R931 Abnormal findings on diagnostic imaging of heart and coronary circulation: Secondary | ICD-10-CM

## 2021-09-06 DIAGNOSIS — E7849 Other hyperlipidemia: Secondary | ICD-10-CM

## 2021-09-06 DIAGNOSIS — I77810 Thoracic aortic ectasia: Secondary | ICD-10-CM

## 2021-09-06 NOTE — Progress Notes (Signed)
? ?Virtual Visit via Video Note  ? ?This visit type was conducted due to national recommendations for restrictions regarding the COVID-19 Pandemic (e.g. social distancing) in an effort to limit this patient's exposure and mitigate transmission in our community.  Due to his co-morbid illnesses, this patient is at least at moderate risk for complications without adequate follow up.  This format is felt to be most appropriate for this patient at this time.  All issues noted in this document were discussed and addressed.  A limited physical exam was performed with this format.  Please refer to the patient's chart for his consent to telehealth for [facility]. ? ?   ? ?Date:  09/06/2021  ? ?ID:  Francisco Phillips, DOB 1965/07/30, MRN [id number] ?The patient was identified using 2 identifiers. ? ?Evaluation Performed:  Follow-Up Visit ? ?Patient Location:  ?[address] ?[place] [id number] ? ?Provider location:   ?[address], [address] ?[place], [place] [address] ? ?PCP:  Deland Pretty, MD  ?Cardiologist:  None ?Electrophysiologist:  None  ? ?Chief Complaint:  Follow-up dyslipidemia ? ?History of Present Illness:   ? ?Francisco Phillips is a 56 y.o. male who presents via audio/video conferencing for a telehealth visit today.  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  Calcium was noted primarily in the LAD which was 82nd percentile for age and sex matched control.  In addition the ascending thoracic aorta was dilated at 4.0 cm.  Patient is asymptomatic.  He has a longstanding history of physical activity and exercise.  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  He has been on rosuvastatin 40 mg daily for years and recently had repeat lipid testing.  His total cholesterol was  234, triglycerides 140, HDL 63 and LDL 143.  Based on this ezetimibe 10 mg daily was added.  He has not had repeat lipids since then.  With his high LDL cholesterol on high potency statin, is very likely has a familial hyperlipidemia.  He does note that he has family members with high cholesterol including his maternal grandmother had high cholesterol.  His mother died in her 4s of pancreatic cancer in her father as well.  He has already had genetic testing for cancers.  He reports a generally healthy diet and exercises fairly regularly. ? ?09/06/2021 ? ?Francisco Phillips returns today for follow-up of dyslipidemia.  After adjusting his medications the plan was to repeat a lipid NMR.  Unfortunately he did not get the reminder message about that that was sent prior to this visit and we do not have new labs to review today.  He had started a number of over-the-counter supplements as well including red yeast rice.  I advised him to discontinue that today as combination therapy with that and a statin increases the risk of myalgias and the benefit of red yeast rice in the face of a high potency statin such as rosuvastatin is very minimal.  Overall he says he feels well, exercises daily and has no chest pain. ? ?The patient does not have symptoms concerning for COVID-19 infection (fever, chills, cough, or new SHORTNESS OF BREATH).  ? ? ?Prior CV studies:   ?The following studies were reviewed today: ? ?Chart reviewed ? ?PMHx:  ?Past Medical History:  ?Diagnosis Date  ? Allergy   ?  seasonal  ? Cancer [facility])   ? basal cell carcinoma face/right chest/left shoulder  ? Family history of breast cancer in male   ? Family history of cystic fibrosis   ? Family history of pancreatic cancer   ? GERD (gastroesophageal reflux disease)   ? Hyperlipidemia   ? ? ?Past Surgical History:  ?Procedure Laterality Date  ? BIOPSY  03/08/2020  ? Procedure: BIOPSY;  Surgeon: Irving Copas., MD;  Location: [place];  Service:  Gastroenterology;;  ? COLONOSCOPY    ? With [place]   ? ESOPHAGOGASTRODUODENOSCOPY    ? Dilation Earle Gell with [facility] long time ago  ? ESOPHAGOGASTRODUODENOSCOPY N/A 03/08/2020  ? Procedure: ESOPHAGOGASTRODUODENOSCOPY (EGD);  Surgeon: Irving Copas., MD;  Location: [place];  Service: Gastroenterology;  Laterality: N/A;  ? EUS N/A 03/08/2020  ? Procedure: UPPER ENDOSCOPIC ULTRASOUND (EUS) RADIAL;  Surgeon: Rush Landmark Telford Nab., MD;  Location: [place];  Service: Gastroenterology;  Laterality: N/A;  ? TONSILLECTOMY    ? UPPER GASTROINTESTINAL ENDOSCOPY  08/2019  ? ? ?FAMHx:  ?Family History  ?Problem Relation Age of Onset  ? Pancreatic cancer Mother 40  ? Breast cancer Father 21  ? Pancreatic cancer Maternal Grandmother 6  ? Pancreatic cancer Maternal Uncle 60  ? Dementia Paternal Grandfather   ? Colon cancer Neg Hx   ? Esophageal cancer Neg Hx   ? Rectal cancer Neg Hx   ? Stomach cancer Neg Hx   ? Colon polyps Neg Hx   ? ? ?SOCHx:  ? reports that he has never smoked. He has never used smokeless tobacco. He reports current alcohol use of about 21.0 standard drinks per week. He reports that he does not use drugs. ? ?ALLERGIES:  ?Allergies  ?Allergen Reactions  ? Bee Venom Anaphylaxis, Hives and Rash  ? Kiwi Extract Anaphylaxis  ? Other Anaphylaxis, Hives and Rash  ?  Kiwi,walnuts,spinach,scallops,poppey seeds  ? Penicillins   ?  Said he can't do it and would prefer another medication  ?Childhood  ? Lamictal [Lamotrigine] Rash  ? ? ?MEDS: ? ?Current Meds  ?Medication Sig  ? Acetylcysteine (NAC 600 PO) Take 600 mg by mouth daily.  ? acidophilus (RISAQUAD) CAPS capsule Take 1 capsule by mouth daily.  ? albuterol (VENTOLIN HFA) 108 (90 Base) MCG/ACT inhaler 2 puffs as needed  ? aspirin EC 81 MG tablet Take 81 mg by mouth daily. Swallow whole.  ? B Complex Vitamins (VITAMIN B-COMPLEX PO) Take 1 tablet by mouth daily.  ? cetirizine (ZYRTEC) 10 MG tablet Take 10  mg by mouth daily.  ? EPINEPHrine (EPIPEN 2-PAK IJ) Inject 1 each as directed as needed (Allergic reaction). For reaction   ? ezetimibe (ZETIA) 10 MG tablet Take 10 mg by mouth daily.  ? Lysine 500 MG TABS Take 500 mg by mouth daily.   ? meloxicam (MOBIC) 15 MG tablet Take 15 mg by mouth daily.  ? MILK THISTLE PO Take 1 tablet by mouth daily.  ? Omega-3 Fatty Acids (FISH OIL) 1200 MG CAPS Take 1,200 mg by mouth daily.   ? OVER THE COUNTER MEDICATION Take 1 tablet by mouth daily. Kenard Gower  ? pantoprazole (PROTONIX) 40 MG tablet TAKE 1 TABLET BY MOUTH TWO TIMES A DAY BEFORE A MEAL  ? rosuvastatin (CRESTOR) 40 MG tablet Take 40 mg by mouth daily.  ? tadalafil (CIALIS) 20 MG tablet Take 20 mg by mouth daily as needed for erectile dysfunction.  ? YOHIMBINE HCL PO  Take 5 mg by mouth daily.  ? [DISCONTINUED] Red Yeast Rice 600 MG CAPS Take 1 capsule by mouth in the morning and at bedtime.  ?  ? ?ROS: ?Pertinent items noted in HPI and remainder of comprehensive ROS otherwise negative. ? ?Labs/Other Tests and Data Reviewed:   ? ?Recent Labs: ?No results found for requested labs within last 8760 hours.  ? ?Recent Lipid Panel ?No results found for: CHOL, TRIG, HDL, CHOLHDL, LDLCALC, LDLDIRECT ? ?Wt Readings from Last 3 Encounters:  ?09/06/21 190 lb (86.2 kg)  ?04/01/21 199 lb 3.2 oz (90.4 kg)  ?12/15/20 199 lb (90.3 kg)  ?  ? ?Exam:   ? ?Vital Signs:  Ht '6\' 2"'$  (1.88 m)   Wt 190 lb (86.2 kg)   BMI 24.39 kg/m?   ? ?General appearance: alert and no distress ?Lungs: No visual respiratory difficulty ?Abdomen: Normal weight ?Extremities: extremities normal, atraumatic, no cyanosis or edema ?Skin: Skin color, texture, turgor normal. No rashes or lesions ?Neurologic: Grossly normal ?Psych: Pleasant ? ?ASSESSMENT & PLAN:   ? ?Probable familial hyperlipidemia ?Family history of high cholesterol ?Abnormal CAC score 103, 82nd percentile (11/2020) ?Dilated ascending aorta to 4.0 cm (11/2020) ? ?Francisco Phillips has been on his medication and  is feeling well.  He is due for repeat lipids and is fasting today.  I advised him to go over the office and have these drawn.  I will reach out to him with those results.  I also advised him to stop red yeast rice wh

## 2021-09-06 NOTE — Patient Instructions (Signed)
Medication Instructions:  ?STOP red yeast rice ? ?*If you need a refill on your cardiac medications before your next appointment, please call your pharmacy* ? ? ?Lab Work: ?FASTING labs today 09/06/21 to check cholesterol  ? ?FASTING lab work in 6 months to check cholesterol  ?-- complete before next visit ? ?If you have labs (blood work) drawn today and your tests are completely normal, you will receive your results only by: ?MyChart Message (if you have MyChart) OR ?A paper copy in the mail ?If you have any lab test that is abnormal or we need to change your treatment, we will call you to review the results. ? ? ?Testing/Procedures: ?NONE ? ? ?Follow-Up: ?At [facility], you and your health needs are our priority.  As part of our continuing mission to provide you with exceptional heart care, we have created designated Provider Care Teams.  These Care Teams include your primary Cardiologist (physician) and Advanced Practice Providers (APPs -  Physician Assistants and Nurse Practitioners) who all work together to provide you with the care you need, when you need it. ? ?We recommend signing up for the patient portal called "MyChart".  Sign up information is provided on this After Visit Summary.  MyChart is used to connect with patients for Virtual Visits (Telemedicine).  Patients are able to view lab/test results, encounter notes, upcoming appointments, etc.  Non-urgent messages can be sent to your provider as well.   ?To learn more about what you can do with MyChart, go to [email or web address].   ? ?Your next appointment:   ? ?6 months with Dr. Debara Pickett  ?

## 2021-09-08 LAB — NMR, LIPOPROFILE
Cholesterol, Total: 184 mg/dL (ref 100–199)
HDL Particle Number: 48.5 umol/L (ref >=30.5)
HDL-C: 66 mg/dL (ref >39)
LDL Particle Number: 1218 nmol/L — ABNORMAL HIGH (ref <1000)
LDL Size: 20.4 nm — ABNORMAL LOW (ref >20.5)
LDL-C (NIH Calc): 107 mg/dL — ABNORMAL HIGH (ref 0–99)
LP-IR Score: 47 — ABNORMAL HIGH (ref <=45)
Small LDL Particle Number: 760 nmol/L — ABNORMAL HIGH (ref <=527)
Triglycerides: 59 mg/dL (ref 0–149)

## 2021-09-28 ENCOUNTER — Other Ambulatory Visit: Payer: Self-pay | Admitting: *Deleted

## 2021-09-28 DIAGNOSIS — E7849 Other hyperlipidemia: Secondary | ICD-10-CM

## 2021-09-28 DIAGNOSIS — I77810 Thoracic aortic ectasia: Secondary | ICD-10-CM

## 2021-09-28 DIAGNOSIS — R931 Abnormal findings on diagnostic imaging of heart and coronary circulation: Secondary | ICD-10-CM

## 2021-09-28 MED ORDER — NEXLETOL 180 MG PO TABS: 1.0000 | ORAL_TABLET | Freq: Every day | ORAL | 3 refills | Status: DC

## 2021-10-03 NOTE — Progress Notes (Signed)
He could do that in addition to [facility] - but I would not rely on that alone to treat his lipids.  Dr Rexene Edison

## 2021-11-28 ENCOUNTER — Inpatient Hospital Stay: Admission: RE | Admit: 2021-11-28 | Payer: Self-pay | Source: Ambulatory Visit

## 2021-12-02 NOTE — Telephone Encounter (Signed)
Called [facility] to check on [facility] as no PA request was received - inquire if patient had picked up. [facility] pharm tech said that no insurance on file for him there and they tried to reach him about cash pay + discount card but got no response. Left message for patient to call back to discuss and that I messaged him in MyChart previously.

## 2021-12-12 ENCOUNTER — Inpatient Hospital Stay: Admission: RE | Admit: 2021-12-12 | Payer: Self-pay | Source: Ambulatory Visit

## 2022-03-13 ENCOUNTER — Ambulatory Visit: Payer: Self-pay | Attending: Internal Medicine | Admitting: Internal Medicine

## 2022-06-22 ENCOUNTER — Emergency Department (HOSPITAL_BASED_OUTPATIENT_CLINIC_OR_DEPARTMENT_OTHER): Payer: 59

## 2022-06-22 ENCOUNTER — Emergency Department (HOSPITAL_BASED_OUTPATIENT_CLINIC_OR_DEPARTMENT_OTHER)
Admission: EM | Admit: 2022-06-22 | Discharge: 2022-06-22 | Disposition: A | Discharge status: Home / Self Care | Payer: 59 | Attending: Emergency Medicine | Admitting: Emergency Medicine

## 2022-06-22 ENCOUNTER — Other Ambulatory Visit: Payer: Self-pay

## 2022-06-22 DIAGNOSIS — Y92310 Basketball court as the place of occurrence of the external cause: Secondary | ICD-10-CM | POA: Diagnosis not present

## 2022-06-22 DIAGNOSIS — S66196A Other injury of flexor muscle, fascia and tendon of right little finger at wrist and hand level, initial encounter: Secondary | ICD-10-CM | POA: Diagnosis not present

## 2022-06-22 DIAGNOSIS — S62646A Nondisplaced fracture of proximal phalanx of right little finger, initial encounter for closed fracture: Secondary | ICD-10-CM | POA: Insufficient documentation

## 2022-06-22 DIAGNOSIS — S6991XA Unspecified injury of right wrist, hand and finger(s), initial encounter: Secondary | ICD-10-CM | POA: Diagnosis not present

## 2022-06-22 DIAGNOSIS — Y9367 Activity, basketball: Secondary | ICD-10-CM | POA: Diagnosis not present

## 2022-06-22 DIAGNOSIS — Z7982 Long term (current) use of aspirin: Secondary | ICD-10-CM | POA: Diagnosis not present

## 2022-06-22 DIAGNOSIS — W230XXA Caught, crushed, jammed, or pinched between moving objects, initial encounter: Secondary | ICD-10-CM | POA: Diagnosis not present

## 2022-06-22 DIAGNOSIS — S63639A Sprain of interphalangeal joint of unspecified finger, initial encounter: Secondary | ICD-10-CM

## 2022-06-22 NOTE — ED Triage Notes (Signed)
Pt states he injured his finger on christmas day and it isn't any better. Pt wasn't seen for this injury. Pt has a splint on it that he placed on christmas.

## 2022-06-22 NOTE — ED Provider Notes (Signed)
[facility] EMERGENCY DEPT Provider Note   CSN: [id number] Arrival date & time: 06/22/22  1631     History  Chief Complaint  Patient presents with   Finger Injury    Francisco Phillips is a 57 y.o. male.  Patient seen by myself on arrival in triage. Pt complains of right fifth digit injury sustained around Christmas.  He was playing basketball with his kids and caught his finger on their shirt.  He has had pain and bruising since that time.  He has had decreased ability to flex the finger.  He applied a splint that he had at home.  Has not followed up but because it was not getting better, came in to get it checked today.  No numbness or tingling.  No wrist or elbow pain or injury.       Home Medications Prior to Admission medications   Medication Sig Start Date End Date Taking? Authorizing Provider  Acetylcysteine (NAC 600 PO) Take 600 mg by mouth daily.    [provider]  acidophilus (RISAQUAD) CAPS capsule Take 1 capsule by mouth daily.    [provider]  albuterol (VENTOLIN HFA) 108 (90 Base) MCG/ACT inhaler 2 puffs as needed 03/17/21   [provider]  aspirin EC 81 MG tablet Take 81 mg by mouth daily. Swallow whole.    [provider]  B Complex Vitamins (VITAMIN B-COMPLEX PO) Take 1 tablet by mouth daily.    [provider]  Bempedoic Acid (NEXLETOL) 180 MG TABS Take 1 tablet by mouth daily. 09/28/21   Hilty, Nadean Corwin, MD  cetirizine (ZYRTEC) 10 MG tablet Take 10 mg by mouth daily.    [provider]  EPINEPHrine (EPIPEN 2-PAK IJ) Inject 1 each as directed as needed (Allergic reaction). For reaction     [provider]  ezetimibe (ZETIA) 10 MG tablet Take 10 mg by mouth daily.    [provider]  Lysine 500 MG TABS Take 500 mg by mouth daily.     [provider]  meloxicam (MOBIC) 15 MG tablet Take 15 mg by mouth daily. 03/12/21   [provider]  MILK THISTLE PO Take 1  tablet by mouth daily.    [provider]  Omega-3 Fatty Acids (FISH OIL) 1200 MG CAPS Take 1,200 mg by mouth daily.     [provider]  OVER THE COUNTER MEDICATION Take 1 tablet by mouth daily. Kenard Gower    [provider]  pantoprazole (PROTONIX) 40 MG tablet TAKE 1 TABLET BY MOUTH TWO TIMES A DAY BEFORE A MEAL 03/28/21   Mansouraty, Telford Nab., MD  rosuvastatin (CRESTOR) 40 MG tablet Take 40 mg by mouth daily. 09/15/19   [provider]  tadalafil (CIALIS) 20 MG tablet Take 20 mg by mouth daily as needed for erectile dysfunction.    [provider]  YOHIMBINE HCL PO Take 5 mg by mouth daily.    [provider]      Allergies    Bee venom, Kiwi extract, Other, Penicillins, and Lamictal [lamotrigine]    Review of Systems   Review of Systems  Physical Exam Updated Vital Signs BP 134/88 (BP Location: Left Arm)   Pulse 76   Temp 97.8 F (36.6 C)   Resp 16   Ht '6\' 2"'$  (1.88 m)   Wt 86.2 kg   SpO2 96%   BMI 24.39 kg/m  Physical Exam Vitals and nursing note reviewed.  Constitutional:  Appearance: He is well-developed.  HENT:     Head: Normocephalic and atraumatic.  Eyes:     Conjunctiva/sclera: Conjunctivae normal.  Pulmonary:     Effort: No respiratory distress.  Musculoskeletal:     Cervical back: Normal range of motion and neck supple.     Comments: Right hand: Ecchymosis of the right small digit.  Decreased ability to flex the finger especially at the DIP joint.  Tenderness worse distal to the MCP.  Skin:    General: Skin is warm and dry.  Neurological:     Mental Status: He is alert.     ED Results / Procedures / Treatments   Labs (all labs ordered are listed, but only abnormal results are displayed) Labs Reviewed - No data to display  EKG None  Radiology DG Hand Complete Right  Result Date: 06/22/2022 CLINICAL DATA:  Injury. EXAM: RIGHT HAND - COMPLETE 3+ VIEW COMPARISON:  None Available. FINDINGS:  Nondisplaced fracture of the pinky finger proximal phalanx. Fracture may approach the metacarpal phalangeal joint about the ulnar aspect. No other fracture. Overall alignment is normal. No erosive change or bone destruction. No soft tissue abnormalities are seen. IMPRESSION: Nondisplaced fracture of the pinky finger proximal phalanx. Electronically Signed   By: Keith Rake M.D.   On: 06/22/2022 18:10    Procedures Procedures    Medications Ordered in ED Medications - No data to display  ED Course/ Medical Decision Making/ A&P    Patient seen and examined. History obtained directly from patient. Work-up including labs, imaging, EKG ordered in triage, if performed, were reviewed.    Labs/EKG: None ordered  Imaging: X-ray of the hand, agree proximal phalanx fracture in the fifth digit  Medications/Fluids: None ordered  Most recent vital signs reviewed and are as follows: BP 134/88 (BP Location: Left Arm)   Pulse 76   Temp 97.8 F (36.6 C)   Resp 16   Ht '6\' 2"'$  (1.88 m)   Wt 86.2 kg   SpO2 96%   BMI 24.39 kg/m   Initial impression: Proximal phalanx fracture, given decreased range of motion, possible flexor tendon injury/tear.  Home treatment plan: Continue use of splint  Return instructions discussed with patient: Return with worsening symptoms or other concerns.  Follow-up instructions discussed with patient: Follow-up with orthopedic hand physician, referral given however patient has contacts in the community.                          Medical Decision Making Amount and/or Complexity of Data Reviewed Radiology: ordered.   Patient with proximal phalanx fracture and possible "[name]".  Finger is neurovascularly intact.  Recommended outpatient orthopedic hand follow-up.  No indication for emergent evaluation or consultation.  No concern for wrist or elbow injury.        Final Clinical Impression(s) / ED Diagnoses Final diagnoses:  Closed nondisplaced  fracture of proximal phalanx of right little finger, initial encounter  [name], initial encounter    Rx / DC Orders ED Discharge Orders     None         Carlisle Cater, PA-C 06/22/22 1933    Varney Biles, MD 06/22/22 2345

## 2022-06-22 NOTE — ED Provider Triage Note (Signed)
Emergency Medicine Provider Triage Evaluation Note  Francisco Phillips , a 58 y.o. male  was evaluated in triage.  Pt complains of right fifth digit injury sustained around Christmas.  He was playing basketball with his kids and caught his finger on their shirt.  He has had pain and bruising since that time.  He has had decreased ability to flex the finger.  He applied a splint that he had at home.  Has not followed up but because it was not getting better, came in to get it checked today.  Review of Systems  Positive: Ecchymosis, weakness Negative: Wrist pain  Physical Exam  BP 134/88 (BP Location: Left Arm)   Pulse 76   Temp 97.8 F (36.6 C)   Resp 16   Ht '6\' 2"'$  (1.88 m)   Wt 86.2 kg   SpO2 96%   BMI 24.39 kg/m  Gen:   Awake, no distress   Resp:  Normal effort  MSK:   Moves extremities without difficulty  Other:  Fifth digit is ecchymotic, decreased ability to flex at the DIP, he is able to extend the finger.  Suspect [name].  Medical Decision Making  Medically screening exam initiated at 5:06 PM.  Appropriate orders placed.  Francisco Phillips was informed that the remainder of the evaluation will be completed by another provider, this initial triage assessment does not replace that evaluation, and the importance of remaining in the ED until their evaluation is complete.     Carlisle Cater, PA-C 06/22/22 1708

## 2022-06-22 NOTE — Discharge Instructions (Signed)
Please read and follow all provided instructions.  Your diagnoses today include:  1. Closed nondisplaced fracture of proximal phalanx of right little finger, initial encounter   2. [name], initial encounter     Tests performed today include: An x-ray of the affected area -shows a fracture of the proximal phalanx of the right little finger, however I am also concerned that you could have a flexor tendon injury given your exam and mechanism of injury Vital signs. See below for your results today.   Medications prescribed:  Please use over-the-counter NSAID medications (ibuprofen, naproxen) or Tylenol (acetaminophen) as directed on the packaging for pain -- as long as you do not have any reasons avoid these medications. Reasons to avoid NSAID medications include: weak kidneys, a history of bleeding in your stomach or gut, or uncontrolled high blood pressure or previous heart attack. Reasons to avoid Tylenol include: liver problems or ongoing alcohol use. Never take more than '4000mg'$  or 8 Extra strength Tylenol in a 24 hour period.     Take any prescribed medications only as directed.  Home care instructions:  Follow any educational materials contained in this packet Follow R.I.C.E. Protocol: R - rest your injury  I  - use ice on injury without applying directly to skin C - compress injury with bandage or splint E - elevate the injury as much as possible  Follow-up instructions: Please follow-up with your orthopedic doctor or the provided orthopedic physician (bone specialist) referral for further evaluation and treatment.    Return instructions:  Please return if your fingers are numb or tingling, appear gray or blue, or you have severe pain (also elevate the arm and loosen splint or wrap if you were given one) Please return to the Emergency Department if you experience worsening symptoms.  Please return if you have any other emergent concerns.  Additional Information:  Your  vital signs today were: BP 134/88 (BP Location: Left Arm)   Pulse 76   Temp 97.8 F (36.6 C)   Resp 16   Ht '6\' 2"'$  (1.88 m)   Wt 86.2 kg   SpO2 96%   BMI 24.39 kg/m  If your blood pressure (BP) was elevated above 135/85 this visit, please have this repeated by your doctor within one month. --------------

## 2022-06-26 DIAGNOSIS — M79644 Pain in right finger(s): Secondary | ICD-10-CM | POA: Diagnosis not present

## 2022-06-26 DIAGNOSIS — M25641 Stiffness of right hand, not elsewhere classified: Secondary | ICD-10-CM | POA: Diagnosis not present

## 2022-06-26 DIAGNOSIS — S62616A Displaced fracture of proximal phalanx of right little finger, initial encounter for closed fracture: Secondary | ICD-10-CM | POA: Diagnosis not present

## 2022-07-03 DIAGNOSIS — M25641 Stiffness of right hand, not elsewhere classified: Secondary | ICD-10-CM | POA: Diagnosis not present

## 2022-07-03 DIAGNOSIS — S62616A Displaced fracture of proximal phalanx of right little finger, initial encounter for closed fracture: Secondary | ICD-10-CM | POA: Diagnosis not present

## 2022-07-10 DIAGNOSIS — M25641 Stiffness of right hand, not elsewhere classified: Secondary | ICD-10-CM | POA: Diagnosis not present

## 2022-07-10 DIAGNOSIS — M79644 Pain in right finger(s): Secondary | ICD-10-CM | POA: Diagnosis not present

## 2022-07-10 DIAGNOSIS — S62616A Displaced fracture of proximal phalanx of right little finger, initial encounter for closed fracture: Secondary | ICD-10-CM | POA: Diagnosis not present

## 2022-07-27 DIAGNOSIS — Z0000 Encounter for general adult medical examination without abnormal findings: Secondary | ICD-10-CM | POA: Diagnosis not present

## 2022-07-27 DIAGNOSIS — Z125 Encounter for screening for malignant neoplasm of prostate: Secondary | ICD-10-CM | POA: Diagnosis not present

## 2022-08-03 DIAGNOSIS — K219 Gastro-esophageal reflux disease without esophagitis: Secondary | ICD-10-CM | POA: Diagnosis not present

## 2022-08-03 DIAGNOSIS — I2510 Atherosclerotic heart disease of native coronary artery without angina pectoris: Secondary | ICD-10-CM | POA: Diagnosis not present

## 2022-08-03 DIAGNOSIS — J309 Allergic rhinitis, unspecified: Secondary | ICD-10-CM | POA: Diagnosis not present

## 2022-08-03 DIAGNOSIS — J4521 Mild intermittent asthma with (acute) exacerbation: Secondary | ICD-10-CM | POA: Diagnosis not present

## 2022-08-03 DIAGNOSIS — N529 Male erectile dysfunction, unspecified: Secondary | ICD-10-CM | POA: Diagnosis not present

## 2022-08-03 DIAGNOSIS — Z9889 Other specified postprocedural states: Secondary | ICD-10-CM | POA: Diagnosis not present

## 2022-08-03 DIAGNOSIS — R749 Abnormal serum enzyme level, unspecified: Secondary | ICD-10-CM | POA: Diagnosis not present

## 2022-08-03 DIAGNOSIS — Z0000 Encounter for general adult medical examination without abnormal findings: Secondary | ICD-10-CM | POA: Diagnosis not present

## 2022-08-03 DIAGNOSIS — Z800 Family history of malignant neoplasm of digestive organs: Secondary | ICD-10-CM | POA: Diagnosis not present

## 2022-08-09 DIAGNOSIS — I2584 Coronary atherosclerosis due to calcified coronary lesion: Secondary | ICD-10-CM | POA: Diagnosis not present

## 2022-08-09 DIAGNOSIS — I2510 Atherosclerotic heart disease of native coronary artery without angina pectoris: Secondary | ICD-10-CM | POA: Diagnosis not present

## 2022-08-09 DIAGNOSIS — E7800 Pure hypercholesterolemia, unspecified: Secondary | ICD-10-CM | POA: Diagnosis not present

## 2022-08-09 DIAGNOSIS — E7841 Elevated Lipoprotein(a): Secondary | ICD-10-CM | POA: Diagnosis not present

## 2022-10-10 ENCOUNTER — Encounter (HOSPITAL_BASED_OUTPATIENT_CLINIC_OR_DEPARTMENT_OTHER): Payer: Self-pay | Admitting: Internal Medicine

## 2022-11-15 DIAGNOSIS — R7989 Other specified abnormal findings of blood chemistry: Secondary | ICD-10-CM | POA: Diagnosis not present

## 2022-11-16 DIAGNOSIS — I2584 Coronary atherosclerosis due to calcified coronary lesion: Secondary | ICD-10-CM | POA: Diagnosis not present

## 2022-11-16 DIAGNOSIS — E7800 Pure hypercholesterolemia, unspecified: Secondary | ICD-10-CM | POA: Diagnosis not present

## 2022-11-16 DIAGNOSIS — E7841 Elevated Lipoprotein(a): Secondary | ICD-10-CM | POA: Diagnosis not present

## 2022-11-16 DIAGNOSIS — I2510 Atherosclerotic heart disease of native coronary artery without angina pectoris: Secondary | ICD-10-CM | POA: Diagnosis not present

## 2022-11-16 DIAGNOSIS — R030 Elevated blood-pressure reading, without diagnosis of hypertension: Secondary | ICD-10-CM | POA: Diagnosis not present

## 2024-02-22 LAB — LAB REPORT - SCANNED: EGFR: 100

## 2024-05-12 ENCOUNTER — Encounter: Payer: Self-pay | Admitting: Internal Medicine

## 2024-05-12 ENCOUNTER — Ambulatory Visit: Payer: Self-pay | Attending: Internal Medicine | Admitting: Internal Medicine

## 2024-05-12 VITALS — BP 116/76 | HR 75 | Resp 16 | Ht 74.0 in | Wt 195.2 lb

## 2024-05-12 DIAGNOSIS — E7849 Other hyperlipidemia: Secondary | ICD-10-CM

## 2024-05-12 DIAGNOSIS — I77810 Thoracic aortic ectasia: Secondary | ICD-10-CM | POA: Diagnosis not present

## 2024-05-12 DIAGNOSIS — R931 Abnormal findings on diagnostic imaging of heart and coronary circulation: Secondary | ICD-10-CM | POA: Diagnosis not present

## 2024-05-12 DIAGNOSIS — E785 Hyperlipidemia, unspecified: Secondary | ICD-10-CM

## 2024-05-12 MED ORDER — REPATHA SURECLICK 140 MG/ML ~~LOC~~ SOAJ: 140.0000 mg | SUBCUTANEOUS | 11 refills | Status: AC

## 2024-05-12 NOTE — Progress Notes (Signed)
 OFFICE CONSULT NOTE  Chief Complaint:  Abnormal calcium score  Primary Care Physician: Clarice Nottingham, MD  HPI:  Francisco Phillips is a 58 y.o. male who is being seen today for the evaluation of abnormal calcium score at the request of Clarice Nottingham, MD. 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  Calcium was noted primarily in the LAD which was 82nd percentile for age and sex matched control.  In addition the ascending thoracic aorta was dilated at 4.0 cm.  Patient is asymptomatic.  He has a longstanding history of physical activity and exercise.  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  He has been on rosuvastatin 40 mg daily for years and recently had repeat lipid testing.  His total cholesterol was 234, triglycerides 140, HDL 63 and LDL 143.  Based on this ezetimibe 10 mg daily was added.  He has not had repeat lipids since then.  With his high LDL cholesterol on high potency statin, is very likely has a familial hyperlipidemia.  He does note that he has family members with high cholesterol including his maternal grandmother had high cholesterol.  His mother died in her 31s of pancreatic cancer in her father as well.  He has already had genetic testing for cancers.  He reports a generally healthy diet and exercises fairly regularly.  05/12/2024  Francisco Phillips returns today for follow-up.  I last saw him via video visit.  Of note he had an elevated LP(a) at 125 nmol/L.  He had been on rosuvastatin 40 mg daily and ezetimibe 10 mg daily.  At some point he was apparently started on Nexlizet but he says he is not taking that medicine.  He is asymptomatic and exercises routinely.  Also of note and his calcium score in 2022 he did have a dilated thoracic aorta at 40 mm.  This  has not been reassessed.  He did have lipid testing in September showing total cholesterol 174, triglycerides 75, HDL 76 and LDL 84.  PMHx:  Past Medical History:  Diagnosis Date   Allergy    seasonal   Cancer (HCC)    basal cell carcinoma face/right chest/left shoulder   Family history of breast cancer in male    Family history of cystic fibrosis    Family history of pancreatic cancer    GERD (gastroesophageal reflux disease)    Hyperlipidemia     Past Surgical History:  Procedure Laterality Date   BIOPSY  03/08/2020   Procedure: BIOPSY;  Surgeon: Mansouraty, Aloha Raddle., MD;  Location: [facility] ENDOSCOPY;  Service: Gastroenterology;;   COLONOSCOPY     With Clarice Ghee [facility]    ESOPHAGOGASTRODUODENOSCOPY     Dilation Gladis Louder with [facility] long time ago   ESOPHAGOGASTRODUODENOSCOPY N/A 03/08/2020   Procedure: ESOPHAGOGASTRODUODENOSCOPY (EGD);  Surgeon: Wilhelmenia Aloha Raddle., MD;  Location: [facility] ENDOSCOPY;  Service: Gastroenterology;  Laterality: N/A;   EUS N/A 03/08/2020   Procedure: UPPER ENDOSCOPIC ULTRASOUND (EUS) RADIAL;  Surgeon: Wilhelmenia Aloha Raddle., MD;  Location: [facility] ENDOSCOPY;  Service: Gastroenterology;  Laterality: N/A;   TONSILLECTOMY     UPPER GASTROINTESTINAL ENDOSCOPY  08/2019    FAMHx:  Family History  Problem Relation Age of Onset   Pancreatic cancer Mother 27   Breast cancer Father 85   Pancreatic cancer  Maternal Grandmother 94   Pancreatic cancer Maternal Uncle 8   Dementia Paternal Grandfather    Colon cancer Neg Hx    Esophageal cancer Neg Hx    Rectal cancer Neg Hx    Stomach cancer Neg Hx    Colon polyps Neg Hx     SOCHx:   reports that he has never smoked. He has never used smokeless tobacco. He reports current alcohol use of about 21.0 standard drinks of alcohol per week. He reports that he does not use drugs.  ALLERGIES:  Allergies  Allergen Reactions   Bee Venom Anaphylaxis, Hives and Rash   Kiwi Extract  Anaphylaxis   Other Anaphylaxis, Hives and Rash    Kiwi,walnuts,spinach,scallops,poppey seeds   Penicillins     Said he can't do it and would prefer another medication  Childhood   Lamictal [Lamotrigine] Rash    ROS: Pertinent items noted in HPI and remainder of comprehensive ROS otherwise negative.  HOME MEDS: Current Outpatient Medications on File Prior to Visit  Medication Sig Dispense Refill   Acetylcysteine (NAC 600 PO) Take 600 mg by mouth daily.     acidophilus (RISAQUAD) CAPS capsule Take 1 capsule by mouth daily.     albuterol (VENTOLIN HFA) 108 (90 Base) MCG/ACT inhaler 2 puffs as needed     aspirin EC 81 MG tablet Take 81 mg by mouth daily. Swallow whole.     B Complex Vitamins (VITAMIN B-COMPLEX PO) Take 1 tablet by mouth daily.     Bempedoic Acid  (NEXLETOL ) 180 MG TABS Take 1 tablet by mouth daily. 90 tablet 3   cetirizine (ZYRTEC) 10 MG tablet Take 10 mg by mouth daily.     EPINEPHrine (EPIPEN 2-PAK IJ) Inject 1 each as directed as needed (Allergic reaction). For reaction      ezetimibe (ZETIA) 10 MG tablet Take 10 mg by mouth daily.     Lysine 500 MG TABS Take 500 mg by mouth daily.      MILK THISTLE PO Take 1 tablet by mouth daily.     Omega-3 Fatty Acids (FISH OIL) 1200 MG CAPS Take 1,200 mg by mouth daily.      pantoprazole  (PROTONIX ) 40 MG tablet TAKE 1 TABLET BY MOUTH TWO TIMES A DAY BEFORE A MEAL 60 tablet 1   rosuvastatin (CRESTOR) 40 MG tablet Take 40 mg by mouth daily.     tadalafil (CIALIS) 20 MG tablet Take 20 mg by mouth daily as needed for erectile dysfunction.     YOHIMBINE HCL PO Take 5 mg by mouth daily.     meloxicam (MOBIC) 15 MG tablet Take 15 mg by mouth daily. (Patient not taking: Reported on 05/12/2024)     OVER THE COUNTER MEDICATION Take 1 tablet by mouth daily. Tongkat Ali     No current facility-administered medications on file prior to visit.    LABS/IMAGING: No results found for this or any previous visit (from the past 48 hours). No  results found.  LIPID PANEL: No results found for: CHOL, TRIG, HDL, CHOLHDL, VLDL, LDLCALC, LDLDIRECT  WEIGHTS: Wt Readings from Last 3 Encounters:  05/12/24 195 lb 3.2 oz (88.5 kg)  06/22/22 190 lb (86.2 kg)  09/06/21 190 lb (86.2 kg)    VITALS: BP 116/76 (BP Location: Left Arm, Patient Position: Sitting, Cuff Size: Normal)   Pulse 75   Resp 16   Ht 6' 2 (1.88 m)   Wt 195 lb 3.2 oz (88.5 kg)   SpO2 95%   BMI  25.06 kg/m   EXAM: General appearance: alert and no distress Neck: no carotid bruit, no JVD, and thyroid not enlarged, symmetric, no tenderness/mass/nodules Lungs: clear to auscultation bilaterally Heart: regular rate and rhythm, S1, S2 normal, no murmur, click, rub or gallop Abdomen: soft, non-tender; bowel sounds normal; no masses,  no organomegaly Extremities: extremities normal, atraumatic, no cyanosis or edema Pulses: 2+ and symmetric Skin: Skin color, texture, turgor normal. 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   Francisco Phillips has a familial hyperlipidemia with elevated LP(a) and cholesterol as well as an abnormal calcium score that is age advanced.  His lipids have been well-controlled, however remain above goal LDL less than 70.  He was also noted to have a high LP(a).  For that  reason he would benefit from the addition of Repatha  to his rosuvastatin and ezetimibe.  He is very compliant with these medicines.  Will reach out for prior authorization for this.  Plan to repeat NMR and LP(a) in about 3 months.  Will also need to get a CT angiogram of the aorta to assess the dilated aorta seen on CT in 2022.  Follow-up in about 4 months.  Francisco KYM Mona, MD, [facility], FNLA, FACP  [place]  [facility]  Medical Director of the Advanced Lipid Disorders &  Cardiovascular Risk Reduction Clinic Diplomate of the American Board of Clinical Lipidology Attending Cardiologist  Direct Dial: [phone]  Fax: [phone]  Website:  [email or web address] Francisco Phillips 05/12/2024, 8:43 AM

## 2024-05-12 NOTE — Patient Instructions (Signed)
 Medication Instructions:  Dr. Mona recommends Repatha  140mg /mL (PCSK9). This is an injectable cholesterol medication self-administered once every 14 days.    Repatha  is self-injected once every 14 days in subcutaneous or fatty tissue - such as belly or side/outer/upper thigh. It is best stored in the refrigerator but is stable at room temp up to 28 days. Please take the pen-injector out of fridge about 30 minutes - 1 hour prior to injection, to allow it to warm closer to room temperature.   This medication is very effective in lowering LDL and can lower LPa, as Dr. Mona mentioned. It is also generally well tolerated -- most common reaction may be cold-like symptoms such as runny nose, scratchy throat, as this is an antibody therapy. It is generally self-limiting and after a few doses, your body should have normalized to the medication.   Here is a demo video: https://www.repatha .com/how-to-start-repatha -injection   If you need a co-pay card for Repatha : https://www.repatha .com/repatha -cost If you need a co-pay card for Praluent: remodelingdvds.fi  Patient Assistance:    These foundations have funds at various times.   The [employer]: https://www.panfoundation.org/disease-funds/hypercholesterolemia/ -- can sign up for wait list  The [facility] offers assistance to help pay for medication copays.  They will cover copays for all cholesterol lowering meds, including statins, fibrates, omega-3 fish oils like Vascepa, ezetimibe, Repatha , Praluent, Nexletol , Nexlizet.  The cards are usually good for $2,500 or 12 months, whichever comes first. Our fax # is [phone] (you will need this to apply) Go to healthwellfoundation.org Click on "Apply Now" Answer questions as to whom is applying (patient or representative) Your disease fund will be "hypercholesterolemia - Medicare access" They will ask questions about finances and which  medications you are taking for cholesterol When you submit, the approval is usually within minutes.  You will need to print the card information from the site You will need to show this information to your pharmacy, they will bill your Medicare Part D plan first -then bill [employer] --for the copay.   You can also call them at [phone], although the hold times can be quite long.     *If you need a refill on your cardiac medications before your next appointment, please call your pharmacy*  Lab Work: FASTING lab work in 3 months -- NMR lipoprofile and LPa  If you have labs (blood work) drawn today and your tests are completely normal, you will receive your results only by: MyChart Message (if you have MyChart) OR A paper copy in the mail If you have any lab test that is abnormal or we need to change your treatment, we will call you to review the results.  Testing/Procedures: CT angiogram chest/aorta to follow up on aorta size (diameter)  Follow-Up: At [facility], you and your health needs are our priority.  As part of our continuing mission to provide you with exceptional heart care, our providers are all part of one team.  This team includes your primary Cardiologist (physician) and Advanced Practice Providers or APPs (Physician Assistants and Nurse Practitioners) who all work together to provide you with the care you need, when you need it.  Your next appointment:   3 months with Dr. Mona  We recommend signing up for the patient portal called MyChart.  Sign up information is provided on this After Visit Summary.  MyChart is used to connect with patients for Virtual Visits (Telemedicine).  Patients are able to view lab/test results, encounter notes, upcoming appointments, etc.  Non-urgent messages can be sent to your provider as well.   To learn more about what you can do with MyChart, go to [email or web address].   Other Instructions

## 2024-05-13 ENCOUNTER — Telehealth: Payer: Self-pay | Admitting: Pharmacy Technician

## 2024-05-13 ENCOUNTER — Other Ambulatory Visit (HOSPITAL_COMMUNITY): Payer: Self-pay

## 2024-05-13 NOTE — Telephone Encounter (Signed)
Waiting on insurance card  

## 2024-05-16 ENCOUNTER — Other Ambulatory Visit (HOSPITAL_COMMUNITY): Payer: Self-pay

## 2024-05-16 ENCOUNTER — Ambulatory Visit (HOSPITAL_COMMUNITY)

## 2024-05-21 ENCOUNTER — Ambulatory Visit (INDEPENDENT_AMBULATORY_CARE_PROVIDER_SITE_OTHER)

## 2024-05-21 ENCOUNTER — Ambulatory Visit (HOSPITAL_COMMUNITY)
Admission: RE | Admit: 2024-05-21 | Discharge: 2024-05-21 | Disposition: A | Discharge status: Home / Self Care | Source: Ambulatory Visit | Attending: Internal Medicine | Admitting: Internal Medicine

## 2024-05-21 ENCOUNTER — Ambulatory Visit: Admitting: Podiatry

## 2024-05-21 ENCOUNTER — Encounter: Payer: Self-pay | Admitting: Podiatry

## 2024-05-21 DIAGNOSIS — R200 Anesthesia of skin: Secondary | ICD-10-CM | POA: Diagnosis not present

## 2024-05-21 DIAGNOSIS — R202 Paresthesia of skin: Secondary | ICD-10-CM | POA: Diagnosis not present

## 2024-05-21 DIAGNOSIS — I77810 Thoracic aortic ectasia: Secondary | ICD-10-CM | POA: Diagnosis present

## 2024-05-21 DIAGNOSIS — M7752 Other enthesopathy of left foot: Secondary | ICD-10-CM

## 2024-05-21 MED ORDER — IOHEXOL 350 MG/ML SOLN
100.0000 mL | Freq: Once | INTRAVENOUS | Status: AC | PRN
  Administered 2024-05-21: 100 mL via INTRAVENOUS

## 2024-05-21 MED ORDER — SODIUM CHLORIDE (PF) 0.9 % IJ SOLN
INTRAMUSCULAR | Status: AC
  Filled 2024-05-21: qty 50

## 2024-05-22 ENCOUNTER — Other Ambulatory Visit (HOSPITAL_COMMUNITY): Payer: Self-pay

## 2024-05-22 NOTE — Progress Notes (Signed)
 Subjective:   Patient ID: Francisco Phillips, male   DOB: 58 y.o.   MRN: [id number]   HPI Patient presents stating he has been developing a lot of pain underneath his left foot and does not remember specific injury.  States that he also has had some numbness in the big toe second toe and he is not sure what may be causing that and patient does like to be very active does not smoke   Review of Systems  All other systems reviewed and are negative.       Objective:  Physical Exam Vitals and nursing note reviewed.  Constitutional:      Appearance: He is well-developed.  Pulmonary:     Effort: Pulmonary effort is normal.  Musculoskeletal:        General: Normal range of motion.  Skin:    General: Skin is warm.  Neurological:     Mental Status: He is alert.   Neuro vascular status was found to be intact muscle strength found to be adequate range of motion adequate with no loss of muscle strength noted.  Good digital perfusion well oriented with inflammation pain around the first metatarsal plantar head with fluid buildup around the joint and no lateral pain noted.      Assessment:  Appears to be inflammatory plantar capsulitis left and may have some back issues with the numbness occurring     Plan:  H&P discussed both conditions educated him on the difference and at this point I am working on the acute inflammation with sterile prep and injected the plantar capsule 3 mg dexamethasone Kenalog  5 mg Xylocaine applied padding to offload weight from the area and instructed on rigid bottom shoes.  Reappoint as symptoms indicate  X-rays indicate that there is no signs of fracture of the bone structure or other bony pathology

## 2024-05-22 NOTE — Telephone Encounter (Signed)
 Lmom for patient to ask about insurance

## 2024-05-28 ENCOUNTER — Ambulatory Visit: Payer: Self-pay | Admitting: Internal Medicine

## 2024-06-02 ENCOUNTER — Ambulatory Visit: Payer: Self-pay | Admitting: Gastroenterology

## 2024-06-02 ENCOUNTER — Encounter: Payer: Self-pay | Admitting: Gastroenterology

## 2024-06-02 VITALS — BP 130/74 | HR 86 | Ht 74.0 in | Wt 194.5 lb

## 2024-06-02 DIAGNOSIS — K760 Fatty (change of) liver, not elsewhere classified: Secondary | ICD-10-CM

## 2024-06-02 DIAGNOSIS — K449 Diaphragmatic hernia without obstruction or gangrene: Secondary | ICD-10-CM

## 2024-06-02 DIAGNOSIS — Z800 Family history of malignant neoplasm of digestive organs: Secondary | ICD-10-CM

## 2024-06-02 DIAGNOSIS — R053 Chronic cough: Secondary | ICD-10-CM

## 2024-06-02 DIAGNOSIS — K2100 Gastro-esophageal reflux disease with esophagitis, without bleeding: Secondary | ICD-10-CM

## 2024-06-02 DIAGNOSIS — R09A2 Foreign body sensation, throat: Secondary | ICD-10-CM

## 2024-06-02 MED ORDER — DEXLANSOPRAZOLE 60 MG PO CPDR: 60.0000 mg | DELAYED_RELEASE_CAPSULE | Freq: Every day | ORAL | 3 refills | Status: AC

## 2024-06-02 NOTE — Progress Notes (Signed)
 Chief Complaint: GERD  GI Hx: 58 year old male initially seen in 08/2019 as a referral for Pancreatic Cancer screening due to strong family history of PC.   Family history notable for the following: -Mother: Pancreatic cancer, age 63. Deceased -Father: Breast cancer, age 74 -Maternal grandmother: Pancreatic cancer, age 38 -Maternal uncle: Pancreatic cancer, age 78 -His 3 sons, his brother, and his sister are all cancer free. -Sister's daughter (his niece) with cystic fibrosis. 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   Pancreatic Cancer screening: -MRI/MRCP (08/2019): Normal.  -EUS: (02/2020): Pancreatic parenchyma abnormalities consisting of lobularity with honeycombing and hyperechoic strands.  Normal PD (1 major B criteria and 1 minor criteria per [facility], classified as Indeterminate for Chronic Pancreatitis) - MRI/MRCP (11/2020): Normal appearing pancreas.  Hepatic steatosis without duct dilation.  Recommending EUS in 6 months - After 11/2020, has been essentially lost to follow-up   Separately, history of solid food dysphagia along with heartburn/regurgitation.  Diagnosed with GERD with erosive esophagitis and distal esophageal stricture on EGD in 08/2019.  Treated with TTS balloon dilation and high-dose PPI with good clinical response.  Repeat EGD in 11/2019 with mucosal improvement in further dilated with 19 mm TTS balloon with resolution of symptoms.       Endoscopic History: -EGD many years ago by Dr. Vicci at [facility] with reported dilation -Colonoscopy many years ago by Dr. Vicci as well and reportedly negative/normal -Cologuard (2021): Negative -EGD (09/16/2019, Dr. San): 4 cm HH, distal esophageal stenosis dilated with 15.5 mm TTS balloon, LA Grade B esophagitis, Hill grade 4 valve, mild gastritis.   Treated with high-dose PPI -EGD (12/09/2019, Dr. San): 4 cm HH, distal esophageal stricture dilated with 19 mm TTS balloon.  Esophageal biopsies c/w GERD and negative for EoE.  Gastritis resolved. - EGD/EUS (02/2020): LA Grade B esophagitis.  EUS findings as above - Colonoscopy (11/2020): 3 mm sigmoid hyperplastic polyp, single diverticulum in ascending colon, small grade 1 internal hemorrhoids.  Repeat in 10 years    HPI:     Francisco Phillips is a 58 y.o. male referred to the Gastroenterology Clinic for evaluation of GERD.  Was last seen by me on 12/01/2020 and essentially lost to follow-up since then.  GI history as above.  At that time, reflux was well-controlled with Protonix  40 mg twice daily.  Dysphagia had resolved with EGD with dilation and PPI therapy.  His main issue today is uncontrolled reflux symptoms described as globus sensation, increased throat clearing, and chronic dry cough.  Does have raspy voice in the mornings.  Drinks plenty of water throughout the day.  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  Currently taking Protonix  40 mg twice daily.  No dysphagia since EGD with dilation.  Sleeps with HOB elevated.  Reviewed last set of liver enzymes and CBC from 09/2020. - AST/ALT 40/39, ALP 85, T. bili 0.3 - Normal renal function, albumin - Normal CBC with WBC 6.4, H/H 14.4/42, PLT 278  Reports having a recent set of labs with his PCP, Dr. Clarice.  Will request those for review.  Was recently seen in the Cardiology Clinic for follow-up last month for dyslipidemia.   Past Medical History:  Diagnosis Date   Allergy    seasonal   Cancer (HCC)  basal cell carcinoma face/right chest/left shoulder   Family history of breast cancer in male    Family history of cystic fibrosis    Family history of pancreatic cancer    GERD (gastroesophageal reflux disease)    Hyperlipidemia      Past Surgical History:  Procedure Laterality Date   BIOPSY  03/08/2020    Procedure: BIOPSY;  Surgeon: Mansouraty, Aloha Raddle., MD;  Location: [facility] ENDOSCOPY;  Service: Gastroenterology;;   COLONOSCOPY     With Francisco Ghee [facility]    ESOPHAGOGASTRODUODENOSCOPY     Dilation Gladis Louder with [facility] long time ago   ESOPHAGOGASTRODUODENOSCOPY N/A 03/08/2020   Procedure: ESOPHAGOGASTRODUODENOSCOPY (EGD);  Surgeon: Wilhelmenia Aloha Raddle., MD;  Location: [facility] ENDOSCOPY;  Service: Gastroenterology;  Laterality: N/A;   EUS N/A 03/08/2020   Procedure: UPPER ENDOSCOPIC ULTRASOUND (EUS) RADIAL;  Surgeon: Wilhelmenia Aloha Raddle., MD;  Location: [facility] ENDOSCOPY;  Service: Gastroenterology;  Laterality: N/A;   TONSILLECTOMY     UPPER GASTROINTESTINAL ENDOSCOPY  08/2019   Family History  Problem Relation Age of Onset   Pancreatic cancer Mother 46   Breast cancer Father 18   Pancreatic cancer Maternal Grandmother 55   Pancreatic cancer Maternal Uncle 14   Dementia Paternal Grandfather    Colon cancer Neg Hx    Esophageal cancer Neg Hx    Rectal cancer Neg Hx    Stomach cancer Neg Hx    Colon polyps Neg Hx    Social History[1] Current Outpatient Medications  Medication Sig Dispense Refill   Acetylcysteine (NAC 600 PO) Take 600 mg by mouth daily.     acidophilus (RISAQUAD) CAPS capsule Take 1 capsule by mouth daily.     albuterol (VENTOLIN HFA) 108 (90 Base) MCG/ACT inhaler 2 puffs as needed     aspirin EC 81 MG tablet Take 81 mg by mouth daily. Swallow whole.     B Complex Vitamins (VITAMIN B-COMPLEX PO) Take 1 tablet by mouth daily.     cetirizine (ZYRTEC) 10 MG tablet Take 10 mg by mouth daily.     EPINEPHrine (EPIPEN 2-PAK IJ) Inject 1 each as directed as needed (Allergic reaction). For reaction      Evolocumab  (REPATHA  SURECLICK) 140 MG/ML SOAJ Inject 140 mg into the skin every 14 (fourteen) days. 2 mL 11   ezetimibe (ZETIA) 10 MG tablet Take 10 mg by mouth daily.     Lysine 500 MG TABS Take 500 mg by mouth daily.      MILK THISTLE PO Take 1  tablet by mouth daily.     Omega-3 Fatty Acids (FISH OIL) 1200 MG CAPS Take 1,200 mg by mouth daily.      OVER THE COUNTER MEDICATION Take 1 tablet by mouth daily. Tongkat Ali     pantoprazole  (PROTONIX ) 40 MG tablet TAKE 1 TABLET BY MOUTH TWO TIMES A DAY BEFORE A MEAL 60 tablet 1   rosuvastatin (CRESTOR) 40 MG tablet Take 40 mg by mouth daily.     tadalafil (CIALIS) 20 MG tablet Take 20 mg by mouth daily as needed for erectile dysfunction.     YOHIMBINE HCL PO Take 5 mg by mouth daily.     No current facility-administered medications for this visit.   Allergies[2]   Review of Systems: All systems reviewed and negative except where noted in HPI.     Physical Exam:    Wt Readings from Last 3 Encounters:  05/12/24 195 lb 3.2 oz (88.5 kg)  06/22/22 190 lb (86.2  kg)  09/06/21 190 lb (86.2 kg)    There were no vitals taken for this visit. Constitutional:  Pleasant, in no acute distress. Psychiatric: Normal mood and affect. Behavior is normal. Cardiovascular: Normal rate, regular rhythm. No edema Pulmonary/chest: Effort normal and breath sounds normal. No wheezing, rales or rhonchi. Abdominal: Soft, nondistended, nontender. Bowel sounds active throughout. There are no masses palpable. No hepatomegaly. Neurological: Alert and oriented to person place and time. Skin: Skin is warm and dry. No rashes noted.   ASSESSMENT AND PLAN;   1) GERD with history of erosive esophagitis 2) Hiatal hernia 3) History of peptic stricture 4) Increased throat clearing 5) Globus sensation 6) Chronic cough Longstanding history of GERD c/b erosive esophagitis, peptic stricture, hiatal hernia.  Typical reflux symptoms largely controlled and dysphagia has resolved with esophageal dilation and high-dose PPI.  Main issue now is what seems to be atypical and LPR type symptoms despite continued high-dose PPI.  Discussed pathophysiology of reflux at length today. - Will trial course of Dexilant  60 mg daily  for diagnostic and therapeutic intent - Can stop Protonix  when starting the Dexilant  - Continue antireflux lifestyle/dietary modification to include sleeping with HOB elevated and avoiding food within 2-3 hours of bedtime - Briefly discussed the role of hiatal hernia repair and antireflux surgery - Depending on response to Dexilant , may consider repeat upper endoscopy to evaluate for presence/severity of erosive esophagitis and whether or not hiatal hernia has changed in size.  Could consider Bravo or pH/Mii on therapy to evaluate for refluxate despite PPI   7) Family history of Pancreatic cancer Strong family history of Pancreatic Cancer.  Previously enrolled in the [facility] screening protocol and had completed MRI/MRCP and EUS, then lost to follow-up.  Discussed PC screening today and he would like to resume protocol. - Schedule MRI/MRCP   8) Hepatic steatosis Prior MRI/MRCP with hepatic steatosis and did have elevated enzymes back in 2021.  Most recent enzymes for review were 2022, although he had recent labs done with his PCP. - Will request records from Dr. Clarice for review - Evaluate for hepatic steatosis at time of MRI as above - Discussed potentially doing FibroScan when locally available    Sandor LULLA Flatter, DO, FACG  06/02/2024, 8:21 AM   Francisco Nottingham, MD     [1]  Social History Tobacco Use   Smoking status: Never   Smokeless tobacco: Never  Vaping Use   Vaping status: Never Used  Substance Use Topics   Alcohol use: Yes    Alcohol/week: 21.0 standard drinks of alcohol    Types: 21 Glasses of wine per week    Comment: 1/2 bottle wine per night    Drug use: No  [2]  Allergies Allergen Reactions   Bee Venom Anaphylaxis, Hives and Rash   Kiwi Extract Anaphylaxis   Other Anaphylaxis, Hives and Rash    Kiwi,walnuts,spinach,scallops,poppey seeds   Penicillins     Said he can't do it and would prefer another medication  Childhood   Lamictal [Lamotrigine] Rash

## 2024-06-02 NOTE — Patient Instructions (Signed)
 _______________________________________________________  If your blood pressure at your visit was 140/90 or greater, please contact your primary care physician to follow up on this.  _______________________________________________________  If you are age 58 or older, your body mass index should be between 23-30. Your Body mass index is 24.97 kg/m. If this is out of the aforementioned range listed, please consider follow up with your Primary Care Provider.  If you are age 30 or younger, your body mass index should be between 19-25. Your Body mass index is 24.97 kg/m. If this is out of the aformentioned range listed, please consider follow up with your Primary Care Provider.   ________________________________________________________  The [facility] providers would like to encourage you to use MYCHART to communicate with providers for non-urgent requests or questions.  Due to long hold times on the telephone, sending your provider a message by [facility] may be a faster and more efficient way to get a response.  Please allow 48 business hours for a response.  Please remember that this is for non-urgent requests.  _______________________________________________________  [facility] is using a team-based approach to care.  Your team is made up of your doctor and two to three APPS. Our APPS (Nurse Practitioners and Physician Assistants) work with your physician to ensure care continuity for you. They are fully qualified to address your health concerns and develop a treatment plan. They communicate directly with your gastroenterologist to care for you. Seeing the Advanced Practice Practitioners on your physician's team can help you by facilitating care more promptly, often allowing for earlier appointments, access to diagnostic testing, procedures, and other specialty referrals.   We have sent the following medications to your pharmacy for you to pick up at your convenience: START: Dexilant   60mg  one capsule daily  DISCONTINUE: pantoprazole   You have been scheduled for an MRI/MRCP at [facility] on 06/07/24. Your appointment time is 9:30am. Please arrive to admitting (at main entrance of the hospital) 15 minutes prior to your appointment time for registration purposes. Please make certain not to have anything to eat or drink 6 hours prior to your test. In addition, if you have any metal in your body, have a pacemaker or defibrillator, please be sure to let your ordering physician know. This test typically takes 45 minutes to 1 hour to complete. Should you need to reschedule, please call [phone] to do so.  Due to recent changes in healthcare laws, you may see the results of your imaging and laboratory studies on MyChart before your provider has had a chance to review them.  We understand that in some cases there may be results that are confusing or concerning to you. Not all laboratory results come back in the same time frame and the provider may be waiting for multiple results in order to interpret others.  Please give us  48 hours in order for your provider to thoroughly review all the results before contacting the office for clarification of your results.   It was a pleasure to see you today!  Vito Cirigliano, D.O.

## 2024-06-05 ENCOUNTER — Ambulatory Visit: Admitting: Podiatry

## 2024-06-05 ENCOUNTER — Encounter: Payer: Self-pay | Admitting: Podiatry

## 2024-06-05 DIAGNOSIS — M25872 Other specified joint disorders, left ankle and foot: Secondary | ICD-10-CM

## 2024-06-05 DIAGNOSIS — M7752 Other enthesopathy of left foot: Secondary | ICD-10-CM

## 2024-06-05 NOTE — Progress Notes (Signed)
 Subjective:   Patient ID: Francisco Phillips, male   DOB: 58 y.o.   MRN: [id number]   HPI Patient states still having mild to moderate pain but improving from previous visit   ROS      Objective:  Physical Exam  Neurovascular status intact with discomfort plantar aspect left first MPJ improving mild edema still noted but much better than it was previously with good range of motion first MPJ     Assessment:  Inflammatory sesamoiditis small possibility fracture with inflammation around the joint surface     Plan:  H&P reviewed I have recommended the continuation of padding reapplied today oral anti-inflammatories as needed reduced activity and shoe gear modification.  We did discuss this in great detail and I did discuss possible MRI or sesamoidectomy if symptoms become chronic educated him on this

## 2024-06-07 ENCOUNTER — Ambulatory Visit (HOSPITAL_COMMUNITY)

## 2024-06-26 ENCOUNTER — Telehealth: Payer: Self-pay | Admitting: Gastroenterology

## 2024-06-26 NOTE — Telephone Encounter (Signed)
 Please contact this patient to let him know that I received and reviewed a copy of his lab work from his PCP dated 02/20/2024 which are notable for the following:  - Normal iron panel - Normal CBC with WBC 6.3, H/H 14.9/45.4, PLT 221, MCV/RDW 96/12.7 - Normal BUN/creatinine - AST/ALT 98/113, ALP 69, T. bili 0.9, albumin 4.8, protein 7.3  These liver enzymes are elevated compared with the last set of labs I had for review at the time of his recent office appointment with me.  Does have a prior history of elevated enzymes, which had normalized in 2022.  Plan for the following: - Will see what the liver looks like at the time of his MRI/MRCP - I recommend extended serologic workup with labs as below - Check PT/INR - Check AMA, ANA, ASMA, A1AT, Hepatitis A, B, C evaluation, ceruloplasmin, LKM Ab, total IgA, IgG, IgM, tTG IgA - Will calculate FIB 4 based on labs with reflex testing such as ELF or elastography based on results

## 2024-06-27 ENCOUNTER — Other Ambulatory Visit: Payer: Self-pay

## 2024-06-27 DIAGNOSIS — R748 Abnormal levels of other serum enzymes: Secondary | ICD-10-CM

## 2024-06-27 DIAGNOSIS — Z800 Family history of malignant neoplasm of digestive organs: Secondary | ICD-10-CM

## 2024-06-27 DIAGNOSIS — K760 Fatty (change of) liver, not elsewhere classified: Secondary | ICD-10-CM

## 2024-06-27 NOTE — Telephone Encounter (Signed)
 Patient aware that he should proceed with MRI/MRCP at [facility] scheduled on 07-07-24 at 9am.  Patient advised to arrive at 8:45, and NPO 4 hours prior.  Patient aware that he should report to our lab in the basement for lab work.  Patient agreed to plan and verbalized understanding. No further questions or concerns.

## 2024-07-03 ENCOUNTER — Other Ambulatory Visit (INDEPENDENT_AMBULATORY_CARE_PROVIDER_SITE_OTHER)

## 2024-07-03 ENCOUNTER — Ambulatory Visit: Payer: Self-pay | Admitting: Gastroenterology

## 2024-07-03 DIAGNOSIS — K760 Fatty (change of) liver, not elsewhere classified: Secondary | ICD-10-CM

## 2024-07-03 DIAGNOSIS — R748 Abnormal levels of other serum enzymes: Secondary | ICD-10-CM

## 2024-07-03 DIAGNOSIS — Z800 Family history of malignant neoplasm of digestive organs: Secondary | ICD-10-CM

## 2024-07-03 LAB — PROTIME-INR
INR: 1 ratio (ref 0.8–1.0)
Prothrombin Time: 10.3 s (ref 9.6–13.1)

## 2024-07-06 LAB — ALPHA-1-ANTITRYPSIN: A-1 Antitrypsin, Ser: 137 mg/dL (ref 83–199)

## 2024-07-06 LAB — IGG: IgG (Immunoglobin G), Serum: 926 mg/dL (ref 600–1640)

## 2024-07-06 LAB — IGM: IgM, Serum: 125 mg/dL (ref 50–300)

## 2024-07-06 LAB — ANA: Anti Nuclear Antibody (ANA): NEGATIVE

## 2024-07-06 LAB — MITOCHONDRIAL ANTIBODIES: Mitochondrial M2 Ab, IgG: <=20 U

## 2024-07-06 LAB — HEPATITIS B SURFACE ANTIBODY,QUALITATIVE: Hep B S Ab: NONREACTIVE

## 2024-07-06 LAB — HEPATITIS A ANTIBODY, TOTAL: Hepatitis A AB,Total: NONREACTIVE

## 2024-07-06 LAB — HEPATITIS B SURFACE ANTIGEN: Hepatitis B Surface Ag: NONREACTIVE

## 2024-07-06 LAB — CERULOPLASMIN: Ceruloplasmin: 20 mg/dL (ref 14–30)

## 2024-07-06 LAB — TISSUE TRANSGLUTAMINASE, IGA: (tTG) Ab, IgA: <1 U/mL

## 2024-07-06 LAB — IGA: Immunoglobulin A: 285 mg/dL (ref 47–310)

## 2024-07-06 LAB — ANTI-SMOOTH MUSCLE ANTIBODY, IGG: Actin (Smooth Muscle) Antibody (IGG): <20 U

## 2024-07-06 LAB — HEPATITIS C ANTIBODY: Hepatitis C Ab: NONREACTIVE

## 2024-07-07 ENCOUNTER — Ambulatory Visit (HOSPITAL_COMMUNITY)
Admission: RE | Admit: 2024-07-07 | Discharge: 2024-07-07 | Disposition: A | Discharge status: Home / Self Care | Source: Ambulatory Visit | Attending: Gastroenterology | Admitting: Gastroenterology

## 2024-07-07 ENCOUNTER — Other Ambulatory Visit: Payer: Self-pay | Admitting: Gastroenterology

## 2024-07-07 DIAGNOSIS — Z800 Family history of malignant neoplasm of digestive organs: Secondary | ICD-10-CM

## 2024-07-07 MED ORDER — GADOBUTROL 1 MMOL/ML IV SOLN: 8.0000 mL | Freq: Once | INTRAVENOUS | Status: DC | PRN

## 2024-07-08 ENCOUNTER — Ambulatory Visit (HOSPITAL_COMMUNITY)
Admission: RE | Admit: 2024-07-08 | Discharge: 2024-07-08 | Disposition: A | Discharge status: Home / Self Care | Source: Ambulatory Visit | Attending: Gastroenterology | Admitting: Gastroenterology

## 2024-07-08 ENCOUNTER — Ambulatory Visit: Payer: Self-pay | Admitting: Gastroenterology

## 2024-07-08 DIAGNOSIS — Z1289 Encounter for screening for malignant neoplasm of other sites: Secondary | ICD-10-CM | POA: Insufficient documentation

## 2024-07-08 DIAGNOSIS — Z800 Family history of malignant neoplasm of digestive organs: Secondary | ICD-10-CM | POA: Insufficient documentation

## 2024-07-08 DIAGNOSIS — K760 Fatty (change of) liver, not elsewhere classified: Secondary | ICD-10-CM | POA: Insufficient documentation

## 2024-07-08 DIAGNOSIS — R160 Hepatomegaly, not elsewhere classified: Secondary | ICD-10-CM | POA: Diagnosis not present

## 2024-07-08 LAB — ANTI-LIVER/KIDNEY AB (RDL): ANTI-LIVER/KIDNEY AB (RDL): <20 U

## 2024-07-08 MED ORDER — GADOBUTROL 1 MMOL/ML IV SOLN
8.0000 mL | Freq: Once | INTRAVENOUS | Status: AC | PRN
  Administered 2024-07-08: 8 mL via INTRAVENOUS

## 2024-08-15 ENCOUNTER — Ambulatory Visit: Payer: Self-pay | Admitting: Internal Medicine
